# Patient Record
Sex: Female | Born: 1980 | Race: Black or African American | Hispanic: No | Marital: Single | Smoking: Never smoker
Health system: Southern US, Community
[De-identification: ages and names within clinical notes are randomized; demographics above are authoritative.]

## PROBLEM LIST (undated history)

## (undated) DIAGNOSIS — I10 Essential (primary) hypertension: Secondary | ICD-10-CM

## (undated) HISTORY — PX: TUBAL LIGATION: SHX77

---

## 2013-08-22 ENCOUNTER — Emergency Department: Payer: Self-pay | Admitting: Emergency Medicine

## 2016-07-27 ENCOUNTER — Emergency Department
Admission: EM | Admit: 2016-07-27 | Discharge: 2016-07-27 | Disposition: A | Discharge status: Home / Self Care | Payer: Managed Care, Other (non HMO) | Attending: Emergency Medicine | Admitting: Emergency Medicine

## 2016-07-27 ENCOUNTER — Encounter: Payer: Self-pay | Admitting: Emergency Medicine

## 2016-07-27 DIAGNOSIS — M5412 Radiculopathy, cervical region: Secondary | ICD-10-CM | POA: Diagnosis not present

## 2016-07-27 DIAGNOSIS — M25512 Pain in left shoulder: Secondary | ICD-10-CM | POA: Insufficient documentation

## 2016-07-27 DIAGNOSIS — I10 Essential (primary) hypertension: Secondary | ICD-10-CM | POA: Diagnosis not present

## 2016-07-27 HISTORY — DX: Essential (primary) hypertension: I10

## 2016-07-27 MED ORDER — PREDNISONE 10 MG PO TABS: ORAL_TABLET | ORAL | 0 refills | Status: DC

## 2016-07-27 MED ORDER — LIDOCAINE 5 % EX PTCH
1.0000 | MEDICATED_PATCH | CUTANEOUS | Status: DC
  Administered 2016-07-27: 1 via TRANSDERMAL
  Filled 2016-07-27: qty 1

## 2016-07-27 MED ORDER — METHYLPREDNISOLONE SODIUM SUCC 125 MG IJ SOLR
125.0000 mg | Freq: Once | INTRAMUSCULAR | Status: AC
  Administered 2016-07-27: 125 mg via INTRAMUSCULAR
  Filled 2016-07-27: qty 2

## 2016-07-27 MED ORDER — CYCLOBENZAPRINE HCL 5 MG PO TABS: 5.0000 mg | ORAL_TABLET | Freq: Three times a day (TID) | ORAL | 0 refills | Status: AC | PRN

## 2016-07-27 MED ORDER — KETOROLAC TROMETHAMINE 60 MG/2ML IM SOLN
30.0000 mg | Freq: Once | INTRAMUSCULAR | Status: AC
  Administered 2016-07-27: 30 mg via INTRAMUSCULAR
  Filled 2016-07-27: qty 2

## 2016-07-27 MED ORDER — IBUPROFEN 600 MG PO TABS: 600.0000 mg | ORAL_TABLET | Freq: Four times a day (QID) | ORAL | 0 refills | Status: DC | PRN

## 2016-07-27 NOTE — ED Notes (Signed)
Left lateral necj pain radiating to left arm x1 week.

## 2016-07-27 NOTE — ED Notes (Signed)

## 2016-07-27 NOTE — ED Triage Notes (Signed)
Pt presents to ED via POV c/o L-sided neck pain/stiffness x1 week. Pt states pain has gradually progressed down into L shoulder/scapula and is now into LFA. Full ROM to neck and LUE. Has been trying muscle rubs at home without relief.

## 2016-07-27 NOTE — ED Provider Notes (Signed)
[facility] Emergency Department Provider Note  ____________________________________________  Time seen: Approximately 11:48 AM  I have reviewed the triage vital signs and the nursing notes.   HISTORY  Chief Complaint Torticollis and Shoulder Pain    HPI Robyn Gordon is a 36 y.o. female that presents to the emergency department with left neck, shoulder, and arm pain for one week. Patient states that it started out with tightness on the left side of her neck one week ago. It has gradually gotten worse. The pain is primarily in her shoulder currently. When she moves her head to the left she occasionally gets a shooting pain that starts from her neck and runs down her arm. She has taken ibuprofen for pain without relief. She has been using hot compresses on her neck, which helps a little bit. She denies any trauma to her neck. This has never happened before. No tick bites. She denies fever, headache, visual changes, shortness breath, chest pain, nausea, vomiting, abdominal pain.   Past Medical History:  Diagnosis Date  . Hypertension     There are no active problems to display for this patient.   Past Surgical History:  Procedure Laterality Date  . TUBAL LIGATION      Prior to Admission medications   Medication Sig Start Date End Date Taking? Authorizing Provider  cyclobenzaprine (FLEXERIL) 5 MG tablet Take 1 tablet (5 mg total) by mouth 3 (three) times daily as needed for muscle spasms. 07/27/16 08/03/16  Enid Derry, PA-C  ibuprofen (ADVIL,MOTRIN) 600 MG tablet Take 1 tablet (600 mg total) by mouth every 6 (six) hours as needed. 07/27/16   Enid Derry, PA-C  predniSONE (DELTASONE) 10 MG tablet Take 6 tablets on day 1, take 5 tablets on day 2, take 4 tablets on day 3, take 3 tablets on day 4, take 2 tablets on day 5, take 1 tablet on day 6 07/27/16   Enid Derry, PA-C    Allergies Patient has no known allergies.  History reviewed. No  pertinent family history.  Social History Social History  Substance Use Topics  . Smoking status: Never Smoker  . Smokeless tobacco: Never Used  . Alcohol use No     Review of Systems  Constitutional: No fever/chills Cardiovascular: No chest pain. Respiratory: No SOB. Gastrointestinal: No abdominal pain.  No nausea, no vomiting.  Musculoskeletal: Positive for neck pain. Skin: Negative for rash, abrasions, lacerations, ecchymosis. Neurological: Negative for headaches   ____________________________________________   PHYSICAL EXAM:  VITAL SIGNS: ED Triage Vitals  Enc Vitals Group     BP 07/27/16 1130 (!) 149/91     Pulse Rate 07/27/16 1130 67     Resp 07/27/16 1130 16     Temp 07/27/16 1130 98.1 F (36.7 C)     Temp Source 07/27/16 1130 Oral     SpO2 07/27/16 1130 100 %     Weight 07/27/16 1130 142 lb (64.4 kg)     Height 07/27/16 1130 5\' 2"  (1.575 m)     Head Circumference --      Peak Flow --      Pain Score 07/27/16 1122 7     Pain Loc --      Pain Edu? --      Excl. in [place]? --      Constitutional: Alert and oriented. Well appearing and in no acute distress. Eyes: Conjunctivae are normal. PERRL. EOMI. Head: Atraumatic. ENT:      Ears:      Nose:  No congestion/rhinnorhea.      Mouth/Throat: Mucous membranes are moist.  Neck: No stridor.  No cervical spine tenderness to palpation. Tenderness to palpation over left trapezius muscle. Pain with left lateral rotation of neck. Cardiovascular: Normal rate, regular rhythm.  Good peripheral circulation. Respiratory: Normal respiratory effort without tachypnea or retractions. Lungs CTAB. Good air entry to the bases with no decreased or absent breath sounds. Musculoskeletal: Full range of motion to all extremities. No gross deformities appreciated. Neurologic:  Normal speech and language. No gross focal neurologic deficits are appreciated.  Skin:  Skin is warm, dry and intact. No rash noted. Psychiatric: Mood and affect  are normal. Speech and behavior are normal. Patient exhibits appropriate insight and judgement.   ____________________________________________   LABS (all labs ordered are listed, but only abnormal results are displayed)  Labs Reviewed - No data to display ____________________________________________  EKG   ____________________________________________  RADIOLOGY   No results found.  ____________________________________________    PROCEDURES  Procedure(s) performed:    Procedures    Medications  lidocaine (LIDODERM) 5 % 1 patch (1 patch Transdermal Patch Applied 07/27/16 1216)  ketorolac (TORADOL) injection 30 mg (30 mg Intramuscular Given 07/27/16 1215)  methylPREDNISolone sodium succinate (SOLU-MEDROL) 125 mg/2 mL injection 125 mg (125 mg Intramuscular Given 07/27/16 1215)     ____________________________________________   INITIAL IMPRESSION / ASSESSMENT AND PLAN / ED COURSE  Pertinent labs & imaging results that were available during my care of the patient were reviewed by me and considered in my medical decision making (see chart for details).  Review of the [place] CSRS was performed in accordance of the [facility] prior to dispensing any controlled drugs.  Patient's diagnosis is consistent with cervical radiculopathy. Vital signs and exam are reassuring. Patient was given Toradol and Solu-Medrol in ED. Patient will be discharged home with prescriptions for prednisone and ibuprofen. Patient is to follow up with PCP as directed. Patient is given ED precautions to return to the ED for any worsening or new symptoms.     ____________________________________________  FINAL CLINICAL IMPRESSION(S) / ED DIAGNOSES  Final diagnoses:  Acute pain of left shoulder  Cervical radiculopathy      NEW MEDICATIONS STARTED DURING THIS VISIT:  Discharge Medication List as of 07/27/2016 12:21 PM    START taking these medications   Details  ibuprofen (ADVIL,MOTRIN) 600 MG tablet  Take 1 tablet (600 mg total) by mouth every 6 (six) hours as needed., Starting Sat 07/27/2016, Print    predniSONE (DELTASONE) 10 MG tablet Take 6 tablets on day 1, take 5 tablets on day 2, take 4 tablets on day 3, take 3 tablets on day 4, take 2 tablets on day 5, take 1 tablet on day 6, Print            This chart was dictated using voice recognition software/Dragon. Despite best efforts to proofread, errors can occur which can change the meaning. Any change was purely unintentional.    Enid DerryWagner, Rya Rausch, PA-C 07/27/16 1322    Don PerkingVeronese, [place], MD 07/28/16 [phone]

## 2020-03-30 ENCOUNTER — Other Ambulatory Visit: Payer: Self-pay | Admitting: Student

## 2020-03-30 DIAGNOSIS — Z1231 Encounter for screening mammogram for malignant neoplasm of breast: Secondary | ICD-10-CM

## 2021-01-18 ENCOUNTER — Other Ambulatory Visit: Payer: Self-pay

## 2021-01-18 ENCOUNTER — Emergency Department: Payer: Commercial Managed Care - PPO

## 2021-01-18 ENCOUNTER — Emergency Department
Admission: EM | Admit: 2021-01-18 | Discharge: 2021-01-18 | Disposition: A | Discharge status: Home / Self Care | Payer: Commercial Managed Care - PPO | Attending: Emergency Medicine | Admitting: Emergency Medicine

## 2021-01-18 ENCOUNTER — Encounter: Payer: Self-pay | Admitting: Emergency Medicine

## 2021-01-18 DIAGNOSIS — M7541 Impingement syndrome of right shoulder: Secondary | ICD-10-CM | POA: Diagnosis not present

## 2021-01-18 DIAGNOSIS — I10 Essential (primary) hypertension: Secondary | ICD-10-CM | POA: Insufficient documentation

## 2021-01-18 DIAGNOSIS — M25511 Pain in right shoulder: Secondary | ICD-10-CM | POA: Diagnosis present

## 2021-01-18 NOTE — ED Provider Notes (Signed)
[facility] Emergency Department Provider Note    ____________________________________________   Event Date/Time   First MD Initiated Contact with Patient 01/18/21 1116     (approximate)  I have reviewed the triage vital signs and the nursing notes.   HISTORY  Chief Complaint Shoulder Pain   HPI Robyn Gordon is a 40 y.o. female, history of hypertension, presents emergency department for evaluation of left-sided shoulder pain.  Patient states that she first noticed her shoulder hurting 3 days ago when she was getting out of the car.  Denies any pop or crack sensation. Reports limited range of motion.  Denies any recent illness or injury.  He has no history of shoulder problems in the past.  Denies fever/chills, chest pain, shortness of breath, neck pain, back pain, or numbness/tingling sensation in her upper or lower extremities.  She has been taking ibuprofen for the pain.   History limited by: No limitations.  Past Medical History:  Diagnosis Date   Hypertension     There are no problems to display for this patient.   Past Surgical History:  Procedure Laterality Date   TUBAL LIGATION      Prior to Admission medications   Medication Sig Start Date End Date Taking? Authorizing Provider  ibuprofen (ADVIL,MOTRIN) 600 MG tablet Take 1 tablet (600 mg total) by mouth every 6 (six) hours as needed. 07/27/16   Laban Emperor, PA-C  predniSONE (DELTASONE) 10 MG tablet Take 6 tablets on day 1, take 5 tablets on day 2, take 4 tablets on day 3, take 3 tablets on day 4, take 2 tablets on day 5, take 1 tablet on day 6 07/27/16   Laban Emperor, PA-C    Allergies Patient has no known allergies.  History reviewed. No pertinent family history.  Social History Social History   Tobacco Use   Smoking status: Never   Smokeless tobacco: Never  Substance Use Topics   Alcohol use: No    Review of Systems Review of Systems  Constitutional:  Negative for  chills and fever.  HENT:  Negative for ear pain and sore throat.   Eyes:  Negative for blurred vision.  Respiratory:  Negative for sputum production and shortness of breath.   Cardiovascular:  Negative for chest pain and leg swelling.  Gastrointestinal:  Negative for abdominal pain and vomiting.  Genitourinary:  Negative for dysuria, flank pain and hematuria.  Musculoskeletal:  Negative for myalgias.       Positive for shoulder pain.  Skin:  Negative for rash.  Neurological:  Negative for headaches.    10-point ROS otherwise negative. ____________________________________________   PHYSICAL EXAM:  VITAL SIGNS: ED Triage Vitals [01/18/21 1055]  Enc Vitals Group     BP (!) 141/87     Pulse Rate 88     Resp 16     Temp 98.6 F (37 C)     Temp Source Oral     SpO2 100 %     Weight 141 lb 15.6 oz (64.4 kg)     Height 5\' 2"  (1.575 m)     Head Circumference      Peak Flow      Pain Score 4     Pain Loc      Pain Edu?      Excl. in [place]?     Physical Exam Constitutional:      General: She is not in acute distress.    Appearance: Normal appearance. She is not ill-appearing.  HENT:     Head: Normocephalic.     Nose: Nose normal.     Mouth/Throat:     Mouth: Mucous membranes are moist.     Pharynx: Oropharynx is clear.  Eyes:     Conjunctiva/sclera: Conjunctivae normal.     Pupils: Pupils are equal, round, and reactive to light.  Cardiovascular:     Rate and Rhythm: Normal rate and regular rhythm.  Pulmonary:     Effort: Pulmonary effort is normal. No respiratory distress.     Breath sounds: Normal breath sounds. No wheezing, rhonchi or rales.  Abdominal:     General: Abdomen is flat. There is no distension.     Palpations: Abdomen is soft.  Musculoskeletal:     Cervical back: Normal range of motion and neck supple.     Comments: Limited range of motion in the right shoulder, particularly with external and internal rotation.  Positive Hawkins and Neer's test.   Tenderness when palpating the proximal humerus.  Pulse, motor, sensation intact distally.   Skin:    General: Skin is warm and dry.  Neurological:     General: No focal deficit present.     Mental Status: She is alert. Mental status is at baseline.  Psychiatric:        Mood and Affect: Mood normal.        Behavior: Behavior normal.        Thought Content: Thought content normal.        Judgment: Judgment normal.     ____________________________________________    LABS  (all labs ordered are listed, but only abnormal results are displayed)  Labs Reviewed - No data to display   ____________________________________________   EKG Not applicable.   ____________________________________________    RADIOLOGY I personally viewed and evaluated these images as part of my medical decision making, as well as reviewing the written report by the radiologist.  ED Provider Interpretation: I agree with the interpretation of the radiologist.  Negative x-ray  DG Shoulder Right  Result Date: 01/18/2021 CLINICAL DATA:  Right shoulder pain. EXAM: RIGHT SHOULDER - 2+ VIEW COMPARISON:  None. FINDINGS: There is no evidence of fracture or dislocation. There is no evidence of arthropathy or other focal bone abnormality. Soft tissues are unremarkable. IMPRESSION: Negative. Electronically Signed   By: Marin Roberts M.D.   On: 01/18/2021 11:39    ____________________________________________   PROCEDURES  Procedures   Medications - No data to display  Critical Care performed: No  ____________________________________________   INITIAL IMPRESSION / ASSESSMENT AND PLAN / ED COURSE  Pertinent labs & imaging results that were available during my care of the patient were reviewed by me and considered in my medical decision making (see chart for details).        Robyn Gordon is a 40 y.o. female, history of hypertension, presents emergency department for evaluation of  left-sided shoulder pain.  Patient states that she first noticed her shoulder hurting 3 days ago when she was getting out of the car.  Denies any pop or crack sensation. Reports limited range of motion.  Denies any recent illness or injury.  He has no history of shoulder problems in the past.  Denies fever/chills, chest pain, shortness of breath, neck pain, back pain, or numbness/tingling sensation in her upper or lower extremities.  She has been taking ibuprofen for the pain.  Differentials include, but not limited to: Serious: ACS, intra-abdominal bleeding, pulmonary embolism, septic joint, shoulder dislocation, fracture (humerus, scapula, clavicle),  Common: rotator cuff injury, shoulder labrum tear, biceps tendinitis, impingement syndrome, adhesive capsulitis, AC joint injury   Patient appears well.  She is found sitting upright comfortably in bed.  No apparent distress.  Physical exam notable for limited range of motion in the right shoulder, particularly with external and internal rotation.  Positive Hawkins and Neer's test.  Tenderness when palpating the proximal humerus.  Pulse, motor, sensation intact distally.  Vital signs are within normal limits.  X-rays negative for acute fractures or dislocation.  Given the patient's history and physical exam, I suspect that the patient is likely suffering from a shoulder impingement syndrome.  No life-threatening pathology suspected.  Provided the patient with information for orthopedic follow-up.  Advised the patient to take Tylenol/ibuprofen as needed for the pain.  Instructed the patient to return the emergency department anytime if she begins to experience any new or worsening symptoms.      ____________________________________________   FINAL CLINICAL IMPRESSION(S) / ED DIAGNOSES  Final diagnoses:  Shoulder impingement syndrome, right     NEW MEDICATIONS STARTED DURING THIS VISIT:  ED Discharge Orders     None        Note:  This  document was prepared using Dragon voice recognition software and may include unintentional dictation errors.    Teodoro Spray, [place] 01/18/21 2047    Vladimir Crofts, MD 01/19/21 1520

## 2021-01-18 NOTE — Discharge Instructions (Addendum)
-  Continue to take ibuprofen or naproxen as needed for pain. -Follow-up with the orthopedist listed above. -Please return the emergency department if you begin to experience any new or worsening symptoms.

## 2021-01-18 NOTE — ED Triage Notes (Signed)
Pt comes into the ED via POV c/o right shoulder pain that started hurting 3 days ago after getting out of the car.  Pt denies hearing a "pop" or anything when getting out of the car and feeling the pain.  Since then patient reports increased tightness in the shoulder with decreased ROM.  Pt in NAD.

## 2022-04-06 IMAGING — CR DG SHOULDER 2+V*R*
1 series · 3 of 3 positions shown · non-contrast
Comparison: None.

CLINICAL DATA: Right shoulder pain.

EXAM:
RIGHT SHOULDER - 2+ VIEW

[Series 1: dg shoulder right · 0.14mm/px · 3 of 3 slices shown]
[im 1/3]
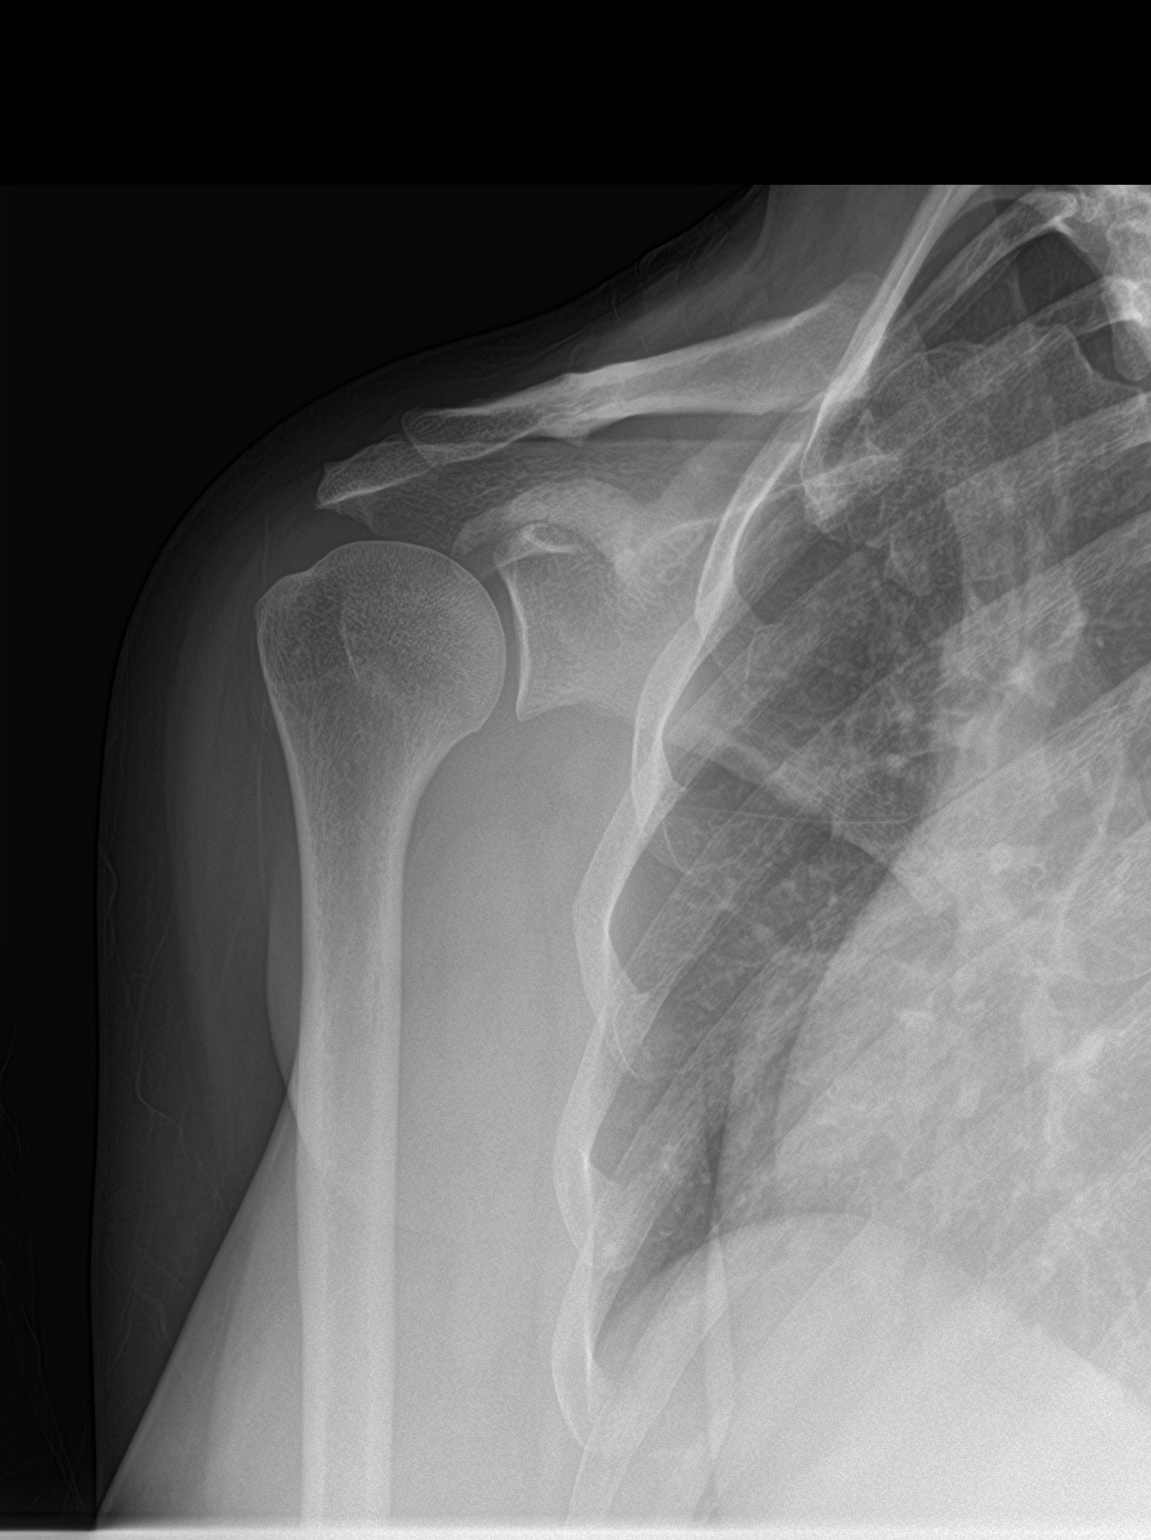
[im 2/3]
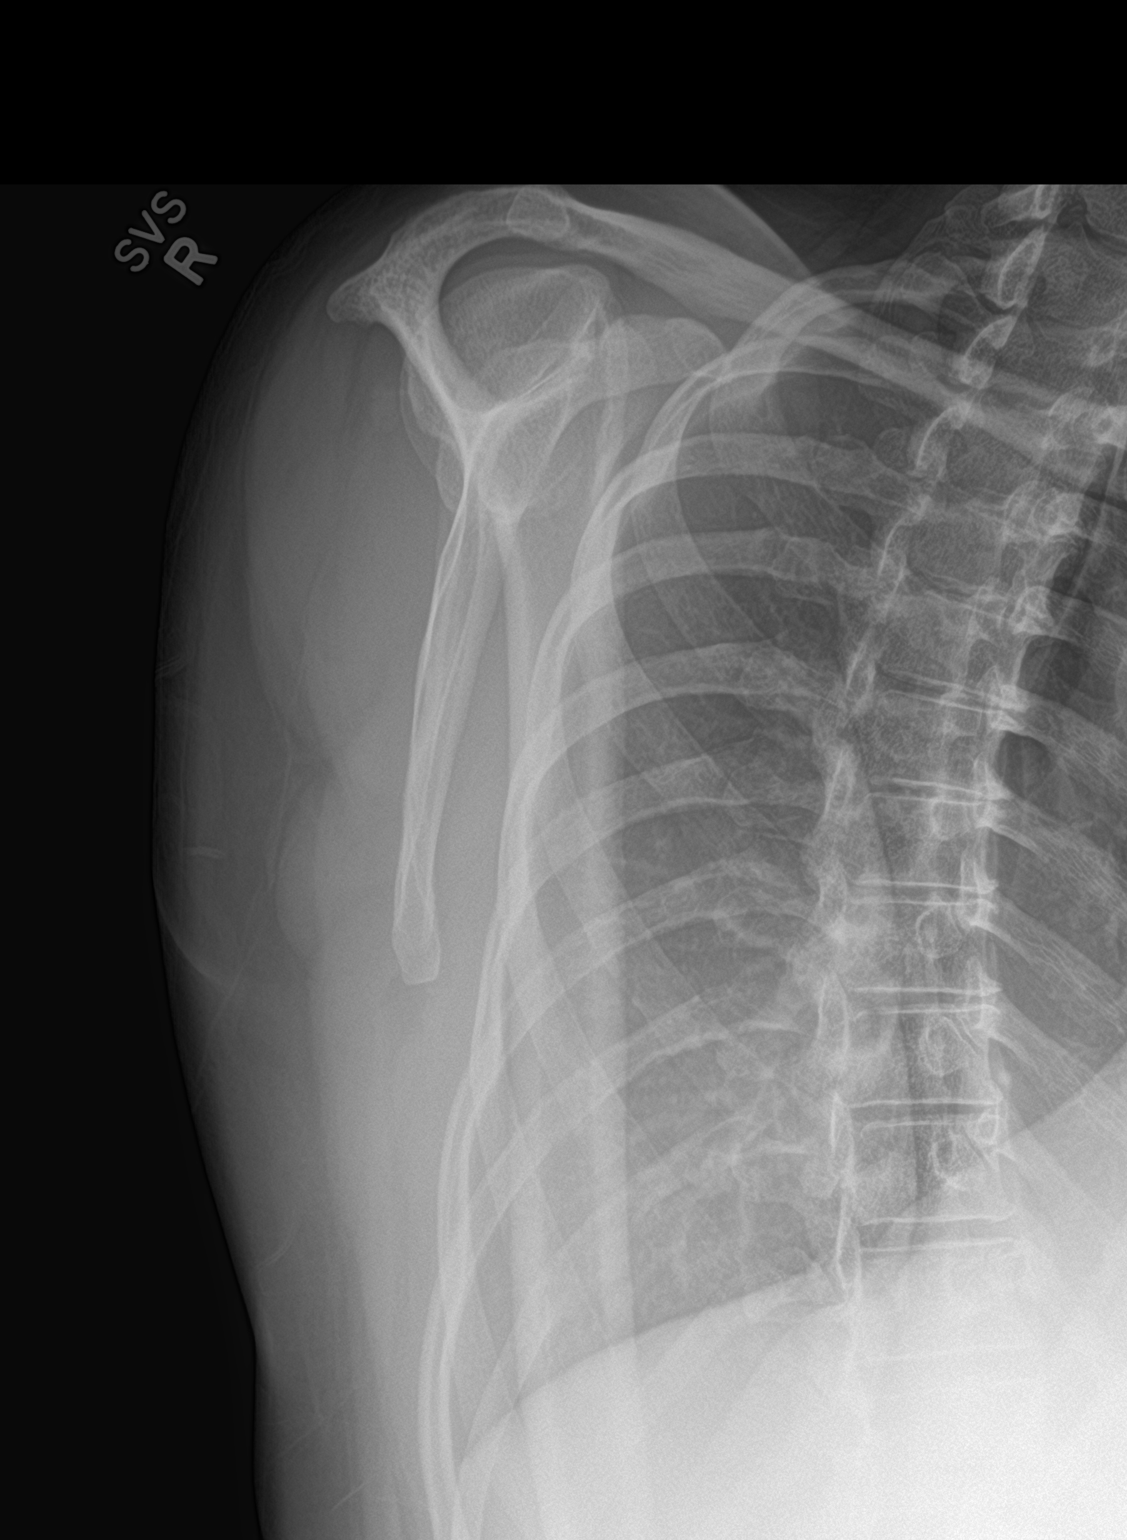
[im 3/3]
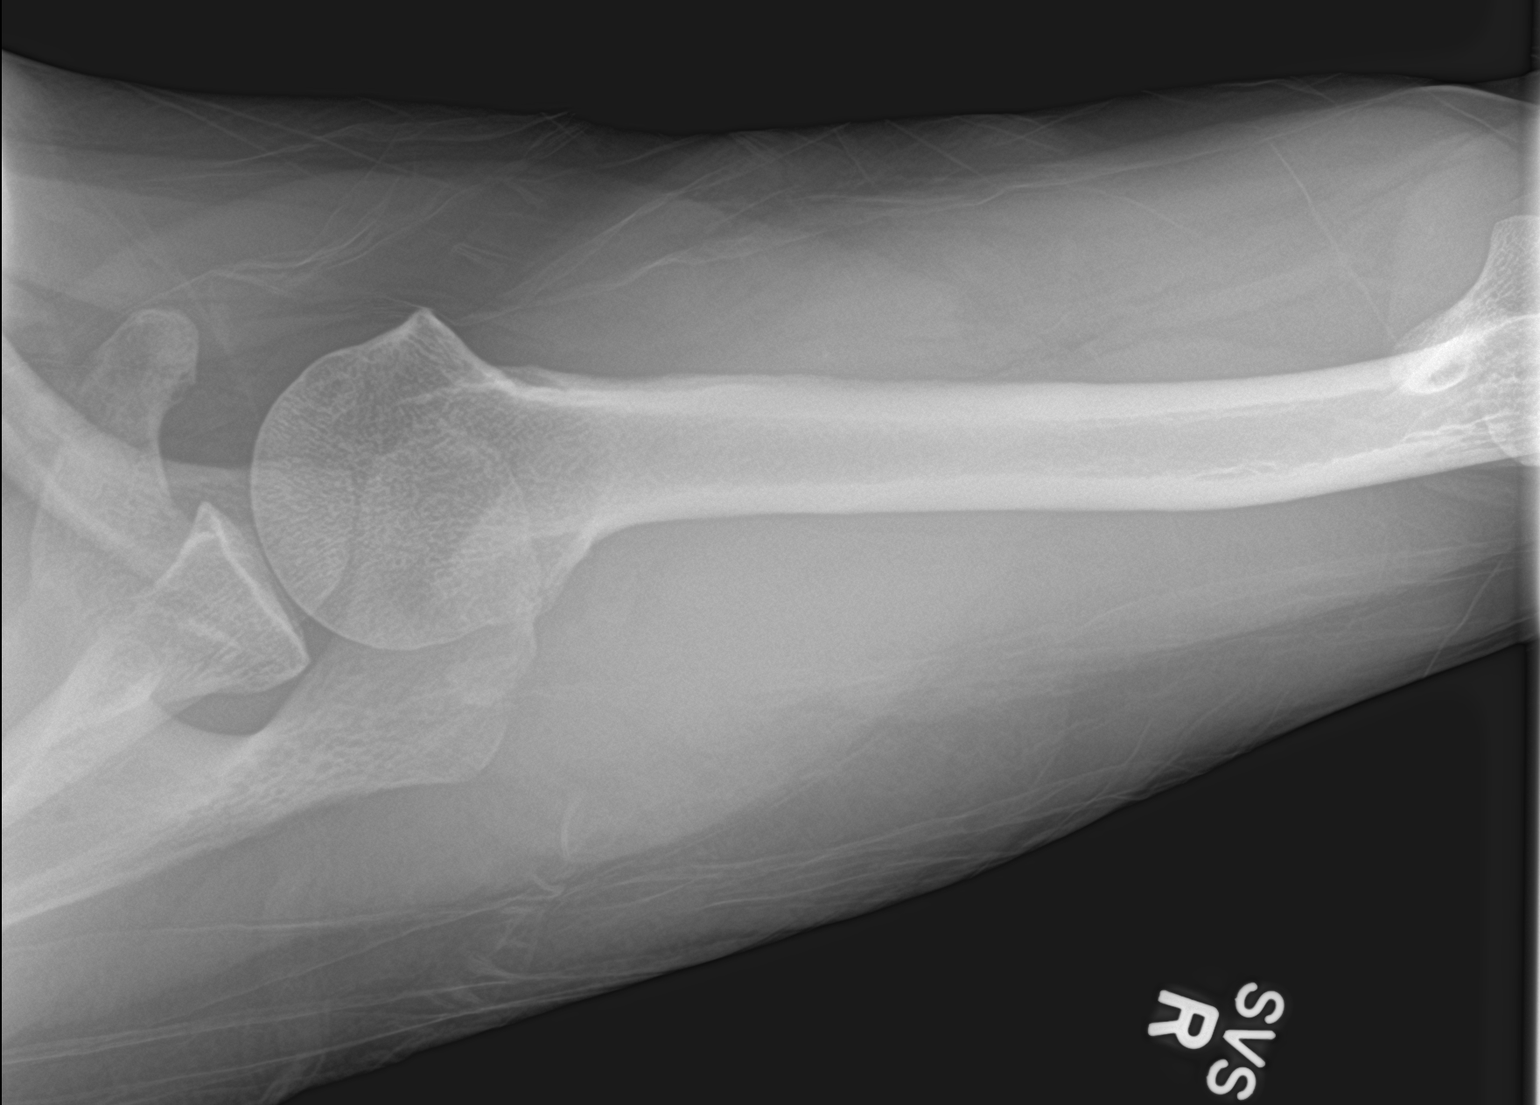

[3 of 3 positions shown; findings below may reference images not displayed]

FINDINGS: There is no evidence of fracture or dislocation. There is no
evidence of arthropathy or other focal bone abnormality. Soft
tissues are unremarkable.
IMPRESSION: Negative.

## 2022-08-14 NOTE — Progress Notes (Signed)
Referring Physician:  Carren Rang, PA-C [address] [place],  [place] [address]  Primary Physician:  Carren Rang, PA-C  History of Present Illness: 08/21/2022 Robyn Gordon has a history of HTN.   Has been seen in ED previously for right shoulder pain.   She has intermittent neck pain that radiates into the left shoulder and arm x 3-4 weeks. Had flare up of this pain a few years ago that resolved. She has numbness and tingling in the left thumb and index finger. Thinks she may have some slight weakness. No pain on right. She had improvement with medrol dose pack. Pain is worse at work- does a lot of standing. Also worse with sitting and laying flat.   She has been seeing [facility] for her lower back- was having LBP with right leg pain. Was told she had DDD. She's had ESIs with some relief. She still has constant LBP with no leg pain. No numbness, tingling, or weakness in her legs.   She is taking motrin with minimal relief.   Bowel/Bladder Dysfunction: she has some urinary urgency  Conservative measures:  Physical therapy: has not participated in recently   Multimodal medical therapy including regular antiinflammatories: tylenol, advil, prednisone  Injections:  has not received epidural steroid injections for her neck  Past Surgery: none  Robyn Gordon has no symptoms of cervical myelopathy.  The symptoms are causing a significant impact on the patient's life.   Review of Systems:  A 10 point review of systems is negative, except for the pertinent positives and negatives detailed in the HPI.  Past Medical History: Past Medical History:  Diagnosis Date   Hypertension     Past Surgical History: Past Surgical History:  Procedure Laterality Date   TUBAL LIGATION      Allergies: Allergies as of 08/21/2022   (No Known Allergies)    Medications: Outpatient Encounter Medications as of 08/21/2022  Medication Sig    amLODipine (NORVASC) 5 MG tablet Take 5 mg by mouth daily.   ibuprofen (ADVIL) 200 MG tablet Take 200 mg by mouth every 6 (six) hours as needed.   [DISCONTINUED] ibuprofen (ADVIL,MOTRIN) 600 MG tablet Take 1 tablet (600 mg total) by mouth every 6 (six) hours as needed.   [DISCONTINUED] predniSONE (DELTASONE) 10 MG tablet Take 6 tablets on day 1, take 5 tablets on day 2, take 4 tablets on day 3, take 3 tablets on day 4, take 2 tablets on day 5, take 1 tablet on day 6   No facility-administered encounter medications on file as of 08/21/2022.    Social History: Social History   Tobacco Use   Smoking status: Never   Smokeless tobacco: Never  Substance Use Topics   Alcohol use: No    Family Medical History: No family history on file.  Physical Examination: Vitals:   08/21/22 1007  BP: 130/82    General: Patient is well developed, well nourished, calm, collected, and in no apparent distress. Attention to examination is appropriate.  Respiratory: Patient is breathing without any difficulty.   NEUROLOGICAL:     Awake, alert, oriented to person, place, and time.  Speech is clear and fluent. Fund of knowledge is appropriate.   Cranial Nerves: Pupils equal round and reactive to light.  Facial tone is symmetric.    No posterior cervical tenderness. Mild tenderness in left trapezial region.   No abnormal lesions on exposed skin.   Strength: Side Biceps Triceps Deltoid Interossei Grip Wrist  Ext. Wrist Flex.  R 5 5 5 5 5 5 5   L 5 5 5 5 5 5 5    Side Iliopsoas Quads Hamstring PF DF EHL  R 5 5 5 5 5 5   L 5 5 5 5 5 5    Reflexes are 2+ and symmetric at the biceps, brachioradialis, patella and achilles.   Hoffman's is absent.  Clonus is not present.   Bilateral upper and lower extremity sensation is intact to light touch.     Gait is normal.    Medical Decision Making  Imaging: No cervical or lumbar imaging.   Assessment and Plan: Robyn Gordon is a pleasant 42 y.o. female has  intermittent neck pain that radiates into the left shoulder and arm x 3-4 weeks. Had flare up of this pain a few years ago that resolved. She has numbness and tingling in the left thumb and index finger. Thinks she may have some slight weakness. No pain on right.   No cervical imaging available. Pain appears to be cervical mediated.   She also has history of chronic constant LBP with right leg pain. Right leg pain has been better since last ESI at [facility], but LBP is constant. No numbness, tingling, or weakness in her legs.   No lumbar imaging available.   Treatment options discussed with patient and following plan made:   - MRI of cervical spine to further evaluate chronic neck pain and cervical radiculopathy.  - Stop motrin. New prescription for mobic. Reviewed dosing and side effects. Take with food.  - ROI signed to get her records from [facility]. She will get lumbar MRI on CD and drop off for me to review.  - Depending on results of cervical MRI, may consider PT and/or injections.  - Will regroup on treatment for lumbar spine once I get her records and imaging from [facility].  - Will schedule follow up visit to review MRI results once I get them back.  - Work note done. She does a lot of heavy lifting at work. Will do lifting/pushing/pulling restrictions pending her follow up.   I spent a total of 30 minutes in face-to-face and non-face-to-face activities related to this patient's care today including review of outside records, review of imaging, review of symptoms, physical exam, discussion of differential diagnosis, discussion of treatment options, and documentation.   Thank you for involving me in the care of this patient.   Drake Leach PA-C Dept. of Neurosurgery

## 2022-08-21 ENCOUNTER — Ambulatory Visit: Payer: Commercial Managed Care - PPO | Admitting: Orthopedic Surgery

## 2022-08-21 ENCOUNTER — Encounter: Payer: Self-pay | Admitting: Orthopedic Surgery

## 2022-08-21 DIAGNOSIS — M542 Cervicalgia: Secondary | ICD-10-CM

## 2022-08-21 DIAGNOSIS — M5441 Lumbago with sciatica, right side: Secondary | ICD-10-CM

## 2022-08-21 DIAGNOSIS — M5412 Radiculopathy, cervical region: Secondary | ICD-10-CM

## 2022-08-21 DIAGNOSIS — M5416 Radiculopathy, lumbar region: Secondary | ICD-10-CM

## 2022-08-21 DIAGNOSIS — G8929 Other chronic pain: Secondary | ICD-10-CM

## 2022-08-21 MED ORDER — MELOXICAM 15 MG PO TABS: 15.0000 mg | ORAL_TABLET | Freq: Every day | ORAL | 2 refills | Status: AC

## 2022-08-21 NOTE — Patient Instructions (Signed)
It was so nice to see you today. Thank you so much for coming in.    I want to get an MRI of your neck to look into things further. We will get this approved through your insurance and [facility] will call you to schedule the appointment.   [facility] is (building with the white pillars) off of [address]. The address is [address], [place], [place] [address].   After you have the MRI, it takes 5-7 days for me to get the results back. Once I have them, we will call you to schedule a follow up visit with me to review them.   We will work on getting your records from [facility] regarding your back. You will need to call them and pick up your lumbar MRI on a CD. Please drop this off to [place] to upload in our system.   Stop the motrin. I sent a prescription for meloxicam to help with pain and inflammation. Take as directed with food.   Please do not hesitate to call if you have any questions or concerns. You can also message me in MyChart.   If you have not heard back about any of the above things in the next week, please call the office so we can help you get them scheduled.   Drake Leach PA-C [phone]

## 2022-09-04 ENCOUNTER — Telehealth: Payer: Self-pay | Admitting: Orthopedic Surgery

## 2022-09-04 ENCOUNTER — Ambulatory Visit
Admission: RE | Admit: 2022-09-04 | Discharge: 2022-09-04 | Disposition: A | Discharge status: Home / Self Care | Payer: Commercial Managed Care - PPO | Source: Ambulatory Visit | Attending: Orthopedic Surgery | Admitting: Orthopedic Surgery

## 2022-09-04 DIAGNOSIS — M542 Cervicalgia: Secondary | ICD-10-CM | POA: Insufficient documentation

## 2022-09-04 DIAGNOSIS — M5412 Radiculopathy, cervical region: Secondary | ICD-10-CM | POA: Diagnosis present

## 2022-09-04 NOTE — Telephone Encounter (Signed)
I can do a note keeping her out from today 8/14 until her f/u with me on 8/26. Will do and print. She will need to pick up.

## 2022-09-04 NOTE — Telephone Encounter (Signed)
Left message to pick up note  °

## 2022-09-04 NOTE — Telephone Encounter (Signed)
Patient is asking if you can write her out of work until she sees you. Her pain has increased in neck, left arm spasms, and the tingling has increased in her left arm also. Her MRI is scheduled for today at 4pm and she is scheduled to see you on 8/26 to review the results.

## 2022-09-05 NOTE — Telephone Encounter (Signed)
Note was picked up.

## 2022-09-11 ENCOUNTER — Encounter: Payer: Self-pay | Admitting: Orthopedic Surgery

## 2022-09-11 NOTE — Progress Notes (Signed)
Cervical MRI dated 09/04/22:  FINDINGS: Alignment: Straightening of the normal cervical lordosis. Trace degenerative retrolisthesis of C6 on C7.   Vertebrae: Vertebral body height maintained without acute or chronic fracture. Bone marrow signal intensity within normal limits. Small benign hemangioma noted involving the right inferior articular process of C3. No worrisome osseous lesions. Mild reactive endplate changes present about the C5-6 and C6-7 interspaces. No other abnormal marrow edema.   Cord: Normal signal and morphology.   Posterior Fossa, vertebral arteries, paraspinal tissues: Empty sella. Visualized brain and posterior fossa otherwise unremarkable. Craniocervical junction normal. Small retention cyst noted at the nasopharynx. Paraspinous soft tissues within normal limits. Normal flow voids seen within the vertebral arteries bilaterally.   Disc levels:   C2-C3: Minimal uncovertebral spurring without significant disc bulge. Mild right-sided facet hypertrophy. No spinal stenosis. Foramina remain patent.   C3-C4: Disc bulge with bilateral uncovertebral spurring, slightly asymmetric to the right. Flattening of the ventral thecal sac without significant spinal stenosis. Foramina remain adequately patent.   C4-C5: Central to right paracentral disc protrusion mildly indents the ventral thecal sac (series 19, image 11). Minimal flattening of the ventral cord without cord signal changes. Thecal sac remains fairly capacious without significant stenosis. Superimposed mild uncovertebral and facet hypertrophy without significant foraminal encroachment.   C5-C6: Degenerative intervertebral disc space narrowing. Left paracentral disc osteophyte complex flattens and indents the ventral thecal sac (series 9, image 16). Mild cord flattening without cord signal changes. Mild spinal stenosis. Superimposed uncovertebral spurring with resultant mild right with moderate left C6  foraminal narrowing.   C6-C7: Degenerative intervertebral disc space narrowing. Left paracentral disc osteophyte complex flattens and indents the ventral thecal sac. Mild cord flattening without cord signal changes. Mild spinal stenosis. Superimposed uncovertebral spurring with resultant moderate bilateral C7 foraminal stenosis.   C7-T1: Negative interspace. Mild right-sided facet hypertrophy. No canal or foraminal stenosis.   T1-2: Small left paracentral disc protrusion indents the ventral thecal sac (series 8, image 28). Mild facet hypertrophy. No spinal stenosis. Foramina remain patent.   IMPRESSION: 1. Small central to right paracentral disc protrusion at C4-5 with minimal flattening of the ventral cord, but no significant spinal stenosis. 2. Left paracentral disc osteophyte complexes at C5-6 and C6-7 with resultant mild spinal stenosis, with moderate left C6 and bilateral C7 foraminal narrowing. 3. Small left paracentral disc protrusion at T1-2 without significant stenosis. 4. Empty sella. While this finding is often incidental in nature and of no clinical significance, this can also be seen in the setting of idiopathic intracranial hypertension.     Electronically Signed   By: Rise Mu M.D.   On: 09/07/2022 04:10  Above MRI reviewed with Dr. Katrinka Blazing. No further workup needed for empty sella.   She has f/u on 09/16/22 to review MRI.

## 2022-09-13 NOTE — Progress Notes (Unsigned)
Referring Physician:  No referring provider defined for this encounter.  Primary Physician:  Carren Rang, PA-C  History of Present Illness: 09/16/2022 Ms. Montzerrat Gloss has a history of HTN.   Last saw me on 08/21/22 for neck and left arm pain. She was written out of work until this visit and was given mobic.   She was to get lumbar MRI from [facility] for me to review. Per their records, MRI from 08/17/19 showed severe DDD L5-S1 with disc bulging and slight desiccation of L4-L5.   She is here to review her cervical MRI scan.   She continues with more constant neck pain that radiates into left arm to her thumb and index finger. She has numbness and tingling in her left arm. She notes some weakness in her left arm. No right sided arm pain.   She states her lower back is doing well since ESIs with [facility].   She was started on neurontin with some improvement.   Bowel/Bladder Dysfunction: she has some urinary urgency  Conservative measures:  Physical therapy: has not participated in recently   Multimodal medical therapy including regular antiinflammatories: tylenol, advil, prednisone  Injections:  has not received epidural steroid injections for her neck  Past Surgery: none  Elecia Wynelle Fanny Burgio has no symptoms of cervical myelopathy.  The symptoms are causing a significant impact on the patient's life.   Review of Systems:  A 10 point review of systems is negative, except for the pertinent positives and negatives detailed in the HPI.  Past Medical History: Past Medical History:  Diagnosis Date   Hypertension     Past Surgical History: Past Surgical History:  Procedure Laterality Date   TUBAL LIGATION      Allergies: Allergies as of 09/16/2022   (No Known Allergies)    Medications: Outpatient Encounter Medications as of 09/16/2022  Medication Sig   amLODipine (NORVASC) 5 MG tablet Take 5 mg by mouth daily.   meloxicam (MOBIC) 15 MG tablet Take 1 tablet (15  mg total) by mouth daily. Take with food.   NEURONTIN 300 MG capsule Take 1 capsule 3 times a day by oral route for 30 days.   No facility-administered encounter medications on file as of 09/16/2022.    Social History: Social History   Tobacco Use   Smoking status: Never   Smokeless tobacco: Never  Substance Use Topics   Alcohol use: No    Family Medical History: History reviewed. No pertinent family history.  Physical Examination: Vitals:   09/16/22 1459  BP: 138/78      Awake, alert, oriented to person, place, and time.  Speech is clear and fluent. Fund of knowledge is appropriate.   Cranial Nerves: Pupils equal round and reactive to light.  Facial tone is symmetric.    No abnormal lesions on exposed skin.   Strength: Side Biceps Triceps Deltoid Interossei Grip Wrist Ext. Wrist Flex.  R 5 5 5 5 5 5 5   L 5 5 5 5 5 5 5    Side Iliopsoas Quads Hamstring PF DF EHL  R 5 5 5 5 5 5   L 5 5 5 5 5 5    Reflexes are 2+ and symmetric at the biceps, brachioradialis, patella and achilles.   Hoffman's is absent.  Clonus is not present.   Bilateral upper and lower extremity sensation is intact to light touch.     Gait is normal.    Medical Decision Making  Imaging: MRI of cervical spine dated 09/04/22:  FINDINGS: Alignment: Straightening of the normal cervical lordosis. Trace degenerative retrolisthesis of C6 on C7.   Vertebrae: Vertebral body height maintained without acute or chronic fracture. Bone marrow signal intensity within normal limits. Small benign hemangioma noted involving the right inferior articular process of C3. No worrisome osseous lesions. Mild reactive endplate changes present about the C5-6 and C6-7 interspaces. No other abnormal marrow edema.   Cord: Normal signal and morphology.   Posterior Fossa, vertebral arteries, paraspinal tissues: Empty sella. Visualized brain and posterior fossa otherwise unremarkable. Craniocervical junction normal. Small  retention cyst noted at the nasopharynx. Paraspinous soft tissues within normal limits. Normal flow voids seen within the vertebral arteries bilaterally.   Disc levels:   C2-C3: Minimal uncovertebral spurring without significant disc bulge. Mild right-sided facet hypertrophy. No spinal stenosis. Foramina remain patent.   C3-C4: Disc bulge with bilateral uncovertebral spurring, slightly asymmetric to the right. Flattening of the ventral thecal sac without significant spinal stenosis. Foramina remain adequately patent.   C4-C5: Central to right paracentral disc protrusion mildly indents the ventral thecal sac (series 19, image 11). Minimal flattening of the ventral cord without cord signal changes. Thecal sac remains fairly capacious without significant stenosis. Superimposed mild uncovertebral and facet hypertrophy without significant foraminal encroachment.   C5-C6: Degenerative intervertebral disc space narrowing. Left paracentral disc osteophyte complex flattens and indents the ventral thecal sac (series 9, image 16). Mild cord flattening without cord signal changes. Mild spinal stenosis. Superimposed uncovertebral spurring with resultant mild right with moderate left C6 foraminal narrowing.   C6-C7: Degenerative intervertebral disc space narrowing. Left paracentral disc osteophyte complex flattens and indents the ventral thecal sac. Mild cord flattening without cord signal changes. Mild spinal stenosis. Superimposed uncovertebral spurring with resultant moderate bilateral C7 foraminal stenosis.   C7-T1: Negative interspace. Mild right-sided facet hypertrophy. No canal or foraminal stenosis.   T1-2: Small left paracentral disc protrusion indents the ventral thecal sac (series 8, image 28). Mild facet hypertrophy. No spinal stenosis. Foramina remain patent.   IMPRESSION: 1. Small central to right paracentral disc protrusion at C4-5 with minimal flattening of the ventral  cord, but no significant spinal stenosis. 2. Left paracentral disc osteophyte complexes at C5-6 and C6-7 with resultant mild spinal stenosis, with moderate left C6 and bilateral C7 foraminal narrowing. 3. Small left paracentral disc protrusion at T1-2 without significant stenosis. 4. Empty sella. While this finding is often incidental in nature and of no clinical significance, this can also be seen in the setting of idiopathic intracranial hypertension.     Electronically Signed   By: Rise Mu M.D.   On: 09/07/2022 04:10   I have personally reviewed the images and agree with the above interpretation.  Above MRI reviewed with Dr. Katrinka Blazing prior to her visit. No further workup regarding emtpy sella.    Assessment and Plan: Ms. Hoffert is a pleasant 42 y.o. female has more constant neck pain that radiates into left arm to her thumb and index finger. She has numbness and tingling in her left arm. She notes some weakness in her left arm. No right sided arm pain.   She has known left paracentral disc at C5-C6 and C6-C7 with mild central stenosis, moderate left C6 and bilateral C7 foraminal stenosis. Also with right sided disc C4-C5.   She states LBP is doing well since having ESI at [facility]. Last MRI in 2021. This has been scanned into our system.   Treatment options discussed with patient and following plan made:   -  Referral to PMR at [facility] for cervical injections.  - PT for cervical spine. Orders to [facility].  - She has seen [facility] for her neck as well. She thinks she may be able to get in for [facility] with them quickly.  - If she sees [facility] for ESI, then I recommend she follow up with them for continued care.  - For now, I will schedule her a f/u with me in 6-8 weeks.  - I have given her a work note to go back on 09/30/22 with restrictions. She will need to get further notes from [facility] if she is transferring her care there.   I spent a total of 25 minutes in face-to-face and  non-face-to-face activities related to this patient's care today including review of outside records, review of imaging, review of symptoms, physical exam, discussion of differential diagnosis, discussion of treatment options, and documentation.   Drake Leach PA-C Dept. of Neurosurgery

## 2022-09-16 ENCOUNTER — Inpatient Hospital Stay
Admission: RE | Admit: 2022-09-16 | Discharge: 2022-09-16 | Disposition: A | Discharge status: Home / Self Care | Payer: Self-pay | Source: Ambulatory Visit | Attending: Orthopedic Surgery | Admitting: Orthopedic Surgery

## 2022-09-16 ENCOUNTER — Encounter: Payer: Self-pay | Admitting: Orthopedic Surgery

## 2022-09-16 ENCOUNTER — Other Ambulatory Visit: Payer: Self-pay

## 2022-09-16 ENCOUNTER — Ambulatory Visit (INDEPENDENT_AMBULATORY_CARE_PROVIDER_SITE_OTHER): Payer: Commercial Managed Care - PPO | Admitting: Orthopedic Surgery

## 2022-09-16 VITALS — BP 138/78 | Ht 62.0 in | Wt 170.0 lb

## 2022-09-16 DIAGNOSIS — Z049 Encounter for examination and observation for unspecified reason: Secondary | ICD-10-CM

## 2022-09-16 DIAGNOSIS — M4802 Spinal stenosis, cervical region: Secondary | ICD-10-CM

## 2022-09-16 DIAGNOSIS — M50121 Cervical disc disorder at C4-C5 level with radiculopathy: Secondary | ICD-10-CM | POA: Diagnosis not present

## 2022-09-16 DIAGNOSIS — M5412 Radiculopathy, cervical region: Secondary | ICD-10-CM

## 2022-09-16 DIAGNOSIS — M47812 Spondylosis without myelopathy or radiculopathy, cervical region: Secondary | ICD-10-CM

## 2022-09-16 NOTE — Patient Instructions (Signed)
It was so nice to see you today. Thank you so much for coming in.    You have some wear and tear in your neck and I think this is causing your neck and left arm pain.   I sent physical therapy orders to [facility]. You can call them if you don't hear from them to schedule your visit.    I want you to see physical medicine and rehab at the [facility] to discuss possible injections in your neck. Dr. Yves Dill, Dr. Mariah Milling, and their PA Alphonzo Lemmings are great and will take good care of you. They should call you to schedule an appointment or you can call them at [phone].   I will see you back in 6-8 weeks. Please do not hesitate to call if you have any questions or concerns. You can also message me in MyChart.   Drake Leach PA-C [phone]

## 2022-11-04 ENCOUNTER — Encounter: Payer: Self-pay | Admitting: Family Medicine

## 2022-11-04 ENCOUNTER — Telehealth: Payer: Self-pay | Admitting: Orthopedic Surgery

## 2022-11-04 DIAGNOSIS — M5412 Radiculopathy, cervical region: Secondary | ICD-10-CM

## 2022-11-04 DIAGNOSIS — M47812 Spondylosis without myelopathy or radiculopathy, cervical region: Secondary | ICD-10-CM

## 2022-11-04 DIAGNOSIS — M542 Cervicalgia: Secondary | ICD-10-CM

## 2022-11-04 NOTE — Addendum Note (Signed)
Addended by: Drake Leach on: 11/04/2022 11:59 AM   Modules accepted: Orders

## 2022-11-04 NOTE — Telephone Encounter (Signed)
Thoughts? Patients next appt is on 11/19/22.

## 2022-11-04 NOTE — Telephone Encounter (Signed)
We discussed at her visit that if she had injection with [facility], then she would continue to follow up with them regarding her neck.   If she still wants to see me, she will need to get a copy of her records from [facility] (including injection she had done) and bring with her. The other option is having her follow up with [facility].   I put in orders for her to go to PT at [facility]. They should call her.   Okay to give a note continuing her work restrictions until follow up with me on 11/19/22.

## 2022-11-04 NOTE — Telephone Encounter (Signed)
I sent her to PMR at [facility] for injections and looks like she has not seen them.   Did she get her injections at [facility]?

## 2022-11-04 NOTE — Telephone Encounter (Signed)
Spoke with patient to reschedule her follow up appointment Wednesday 10.16.24. patient is wondering since she will not be seeing Drake Leach this week, can she have a new work note to continue her restrictions until her next f/u appointment?   Patient states even with the injections she has had, she is still having pain in her neck.

## 2022-11-04 NOTE — Telephone Encounter (Signed)
Spoke to patient she will get her records from [facility] and she wants to continue care at our office. A note has been done and is up front for pick up.

## 2022-11-04 NOTE — Telephone Encounter (Signed)
She had an injection at [facility] on September 4th. She had relief for about a week. The pain and burning and aching has come back, the tingling has gone away. She is also wanting to see if she can do PT at another location, [facility] possibly.

## 2022-11-06 ENCOUNTER — Ambulatory Visit: Payer: Commercial Managed Care - PPO | Admitting: Orthopedic Surgery

## 2022-11-17 NOTE — Progress Notes (Deleted)
Referring Physician:  Carren Rang, PA-C [address] [place],  [place] [address]  Primary Physician:  Carren Rang, PA-C  History of Present Illness: 11/17/2022 Ms. Hilarie Figge has a history of HTN.   Last seen by me on 09/16/22 for more constant neck pain that radiates into left arm to her thumb and index finger. She has numbness and tingling in her left arm. She notes some weakness in her left arm. No right sided arm pain.    She has known left paracentral disc at C5-C6 and C6-C7 with mild central stenosis, moderate left C6 and bilateral C7 foraminal stenosis. Also with right sided disc C4-C5.   She had cervical ESI at [facility]- she had C7-T1 IL ESI on 09/25/22.   PT was ordered at [facility] and not started yet. She has been out of work since her last visit with me.   She is here for follow up.      Last saw me on 08/21/22 for neck and left arm pain. She was written out of work until this visit and was given mobic.   She was to get lumbar MRI from [facility] for me to review. Per their records, MRI from 08/17/19 showed severe DDD L5-S1 with disc bulging and slight desiccation of L4-L5.   She is here to review her cervical MRI scan.   She continues with more constant neck pain that radiates into left arm to her thumb and index finger. She has numbness and tingling in her left arm. She notes some weakness in her left arm. No right sided arm pain.   She states her lower back is doing well since ESIs with [facility].   She was started on neurontin with some improvement.   Bowel/Bladder Dysfunction: she has some urinary urgency  Conservative measures:  Physical therapy: has not participated in recently   Multimodal medical therapy including regular antiinflammatories: tylenol, advil, prednisone  Injections:   C7-T1 IL ESI 09/25/22 ([facility])  Past Surgery: none  Caretha Herndon Orlowski has no symptoms of cervical myelopathy.  The symptoms are causing a  significant impact on the patient's life.   Review of Systems:  A 10 point review of systems is negative, except for the pertinent positives and negatives detailed in the HPI.  Past Medical History: Past Medical History:  Diagnosis Date   Hypertension     Past Surgical History: Past Surgical History:  Procedure Laterality Date   TUBAL LIGATION      Allergies: Allergies as of 11/19/2022   (No Known Allergies)    Medications: Outpatient Encounter Medications as of 11/19/2022  Medication Sig   amLODipine (NORVASC) 5 MG tablet Take 5 mg by mouth daily.   meloxicam (MOBIC) 15 MG tablet Take 1 tablet (15 mg total) by mouth daily. Take with food.   NEURONTIN 300 MG capsule Take 1 capsule 3 times a day by oral route for 30 days.   No facility-administered encounter medications on file as of 11/19/2022.    Social History: Social History   Tobacco Use   Smoking status: Never   Smokeless tobacco: Never  Substance Use Topics   Alcohol use: No    Family Medical History: No family history on file.  Physical Examination: There were no vitals filed for this visit.     Awake, alert, oriented to person, place, and time.  Speech is clear and fluent. Fund of knowledge is appropriate.   Cranial Nerves: Pupils equal round and reactive to light.  Facial  tone is symmetric.    No abnormal lesions on exposed skin.   Strength: Side Biceps Triceps Deltoid Interossei Grip Wrist Ext. Wrist Flex.  R 5 5 5 5 5 5 5   L 5 5 5 5 5 5 5    Side Iliopsoas Quads Hamstring PF DF EHL  R 5 5 5 5 5 5   L 5 5 5 5 5 5    Reflexes are 2+ and symmetric at the biceps, brachioradialis, patella and achilles.   Hoffman's is absent.  Clonus is not present.   Bilateral upper and lower extremity sensation is intact to light touch.     Gait is normal.    Medical Decision Making  Imaging: None   Assessment and Plan: Ms. Turnier is a pleasant 42 y.o. female has more constant neck pain that radiates  into left arm to her thumb and index finger. She has numbness and tingling in her left arm. She notes some weakness in her left arm. No right sided arm pain.   She has known left paracentral disc at C5-C6 and C6-C7 with mild central stenosis, moderate left C6 and bilateral C7 foraminal stenosis. Also with right sided disc C4-C5.   She states LBP is doing well since having ESI at [facility]. Last MRI in 2021. This has been scanned into our system.   Treatment options discussed with patient and following plan made:   - Referral to PMR at [facility] for cervical injections.  - PT for cervical spine. Orders to [facility].  - She has seen [facility] for her neck as well. She thinks she may be able to get in for [facility] with them quickly.  - If she sees [facility] for ESI, then I recommend she follow up with them for continued care.  - For now, I will schedule her a f/u with me in 6-8 weeks.  - I have given her a work note to go back on 09/30/22 with restrictions. She will need to get further notes from [facility] if she is transferring her care there.   I spent a total of 25 minutes in face-to-face and non-face-to-face activities related to this patient's care today including review of outside records, review of imaging, review of symptoms, physical exam, discussion of differential diagnosis, discussion of treatment options, and documentation.   Drake Leach PA-C Dept. of Neurosurgery

## 2022-11-19 ENCOUNTER — Ambulatory Visit: Payer: Commercial Managed Care - PPO | Admitting: Orthopedic Surgery

## 2022-11-26 ENCOUNTER — Ambulatory Visit: Payer: Commercial Managed Care - PPO | Admitting: Physical Therapy

## 2022-11-28 ENCOUNTER — Ambulatory Visit: Payer: Commercial Managed Care - PPO | Attending: Orthopedic Surgery | Admitting: Physical Therapy

## 2022-11-28 ENCOUNTER — Ambulatory Visit: Payer: Commercial Managed Care - PPO | Admitting: Physical Therapy

## 2022-11-28 VITALS — BP 120/100 | HR 86

## 2022-11-28 DIAGNOSIS — M5412 Radiculopathy, cervical region: Secondary | ICD-10-CM | POA: Diagnosis not present

## 2022-11-28 DIAGNOSIS — M542 Cervicalgia: Secondary | ICD-10-CM | POA: Diagnosis present

## 2022-11-28 DIAGNOSIS — M47812 Spondylosis without myelopathy or radiculopathy, cervical region: Secondary | ICD-10-CM | POA: Diagnosis not present

## 2022-11-28 NOTE — Therapy (Signed)
OUTPATIENT PHYSICAL THERAPY CERVICAL EVALUATION   Patient Name: Robyn Gordon MRN: [id number] DOB:1980-05-05, 42 y.o., female Today's Date: 11/28/2022  END OF SESSION:  PT End of Session - 11/28/22 1825     Visit Number 1    Number of Visits 20    Date for PT Re-Evaluation 02/06/23    Authorization Type UHC/UMR 2024    Authorization Time Period 10    Authorization - Visit Number 1    Authorization - Number of Visits 20    Progress Note Due on Visit 10    PT Start Time 0447    PT Stop Time 0532    PT Time Calculation (min) 45 min    Activity Tolerance Patient tolerated treatment well    Behavior During Therapy WFL for tasks assessed/performed             Past Medical History:  Diagnosis Date   Hypertension    Past Surgical History:  Procedure Laterality Date   TUBAL LIGATION     There are no problems to display for this patient.   PCP: Carren Rang, PA-C   REFERRING PROVIDER: Drake Leach, PA-C   REFERRING DIAG:  [phone] (ICD-10-CM) - Cervical radiculopathy M47.812 (ICD-10-CM) - Cervical spondylosis M54.2 (ICD-10-CM) - Neck pain    THERAPY DIAG:  Neck pain on left side  Rationale for Evaluation and Treatment: Rehabilitation  ONSET DATE: ~6 months  SUBJECTIVE:                                                                                                                                                                                                         SUBJECTIVE STATEMENT: Pt having no pain right now.  Hand dominance: Right  PERTINENT HISTORY:  Left sided neck pain with pain, numbness and tingling on left side. Pain started as neck pain with temperature sensativity. Had a steroid injection which helped, but numbness has started to come back. Head position affects symptoms down arm.  PAIN:  Are you having pain? Yes: NPRS scale: 0/10 now but can increase to 6/10 Pain location: Neck, radiating to left arm and hand Pain description:  Sharp, can be "burning cold" Aggravating factors: Reaching, looking around, lifting, sleeping Relieving factors: Heat, moving out of aggravating positions    PRECAUTIONS: None  RED FLAGS: None     WEIGHT BEARING RESTRICTIONS: No  FALLS:  Has patient fallen in last 6 months? 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  DIAGNOSTIC FINDINGS:  CLINICAL DATA:  Initial evaluation for chronic neck pain, numbness in right arm and fingers.   EXAM: MRI CERVICAL SPINE WITHOUT CONTRAST   TECHNIQUE: Multiplanar, multisequence MR imaging of the cervical spine was performed. No intravenous contrast was administered.   COMPARISON:  None Available.   FINDINGS: Alignment: Straightening of the normal cervical lordosis. Trace degenerative retrolisthesis of C6 on C7.   Vertebrae: Vertebral body height maintained without acute or chronic fracture. Bone marrow signal intensity within normal limits. Small benign hemangioma noted involving the right inferior articular process of C3. No worrisome osseous lesions. Mild reactive endplate changes present about the C5-6 and C6-7 interspaces. No other abnormal marrow edema.   Cord: Normal signal and morphology.   Posterior Fossa, vertebral arteries, paraspinal tissues: Empty sella. Visualized brain and posterior fossa otherwise unremarkable. Craniocervical junction normal. Small retention cyst noted at the nasopharynx. Paraspinous soft tissues within normal limits.  Normal flow voids seen within the vertebral arteries bilaterally.   Disc levels:   C2-C3: Minimal uncovertebral spurring without significant disc bulge. Mild right-sided facet hypertrophy. No spinal stenosis. Foramina remain patent.   C3-C4: Disc bulge with bilateral uncovertebral spurring, slightly asymmetric to the right. Flattening of the ventral thecal sac without significant spinal stenosis. Foramina remain adequately patent.   C4-C5: Central to right paracentral disc protrusion mildly indents the ventral thecal sac (series 19, image 11). Minimal flattening of the ventral cord without cord signal changes. Thecal sac remains fairly capacious without significant stenosis. Superimposed mild uncovertebral and facet hypertrophy without significant foraminal encroachment.   C5-C6: Degenerative intervertebral disc space narrowing. Left paracentral disc osteophyte complex flattens and indents the ventral thecal sac (series 9, image 16). Mild cord flattening without cord signal changes. Mild spinal stenosis. Superimposed uncovertebral spurring with resultant mild right with moderate left C6 foraminal narrowing.   C6-C7: Degenerative intervertebral disc space narrowing. Left paracentral disc osteophyte complex flattens and indents the ventral thecal sac. Mild cord flattening without cord signal changes. Mild spinal stenosis. Superimposed uncovertebral spurring with resultant moderate bilateral C7 foraminal stenosis.   C7-T1: Negative interspace. Mild right-sided facet hypertrophy. No canal or foraminal stenosis.   T1-2: Small left paracentral disc protrusion indents the ventral thecal sac (series 8, image 28). Mild facet hypertrophy. No spinal stenosis. Foramina remain patent.   IMPRESSION: 1. Small central to right paracentral disc protrusion at C4-5 with minimal flattening of the ventral cord, but no significant spinal stenosis. 2. Left paracentral disc osteophyte complexes  at C5-6 and C6-7 with resultant mild spinal stenosis, with moderate left C6 and bilateral C7 foraminal narrowing. 3. Small left paracentral disc protrusion at T1-2 without significant stenosis. 4. Empty sella. While this finding is often incidental in nature and of no clinical significance, this can also be seen in the setting of idiopathic intracranial hypertension.     Electronically Signed   By: Rise Mu M.D.   On: 09/07/2022 04:10  PATIENT SURVEYS:  FOTO - deferred to next session  COGNITION: Overall cognitive status: Within functional limits for tasks assessed  SENSATION: Not tested  POSTURE: No Significant postural limitations    CERVICAL ROM:  Active ROM A/PROM (deg) eval  Flexion 34  Extension 28  Right lateral flexion 17  Left lateral flexion 12  Right rotation 60  Left rotation 60   (Blank rows = not tested)  UPPER EXTREMITY ROM:  Active/Passive ROM Right eval Left eval  Shoulder flexion    Shoulder extension    Shoulder abduction    Shoulder adduction    Shoulder extension    Shoulder internal rotation    Shoulder external rotation    Elbow flexion    Elbow extension    Wrist flexion    Wrist extension    Wrist ulnar deviation    Wrist radial deviation    Wrist pronation    Wrist supination     (Blank rows = not tested)  UPPER EXTREMITY MMT:  MMT Right eval Left eval  Shoulder flexion 4+ 4+  Shoulder extension    Shoulder abduction 4+ 4+  Shoulder adduction    Shoulder extension    Shoulder internal rotation    Shoulder external rotation    Middle trapezius 4 4  Lower trapezius 4- 3+  Elbow flexion    Elbow extension    Wrist flexion    Wrist extension    Wrist ulnar deviation    Wrist radial deviation    Wrist pronation    Wrist supination    Grip strength     (Blank rows = not tested)  CERVICAL SPECIAL TESTS:  Upper limb tension test (ULTT): Positive, Spurling's test: Positive, and Distraction test:  Positive   TODAY'S TREATMENT:                                                                                                                              DATE:   11/28/22 ThereEx: Median nerve glides 2x30s Resisted Y's with yellow TB 2x10 per arm    PATIENT EDUCATION:  Education details: Pt educated on results of tests and course of PT Person educated: Patient Education method: Explanation Education comprehension: verbalized understanding  HOME EXERCISE PROGRAM: Median nerve glides - 3x30s - 1x/day Resisted y's - 2x10 per side - 3-4x/week   ASSESSMENT:  CLINICAL IMPRESSION:  Patient is a 42 y.o. AA female who was seen today for physical therapy evaluation and treatment for left sided neck pain. Positive Spurling's, ULLT, and distraction tests all help to rule in left sided radiculopathy as a pain generator. Pt deficits include decreased neck range of motion, decreased periscapular strength bilaterally, left sided neck pain radiating to left hand, and impaired sensation. These limit her ability to lift, reach overhead, and look around, which in turn restrict her ability to drive, do her hair, and participate fully at her job. PT would be beneficial to address the aforementioned deficits and help her to work and perform ADL's  OBJECTIVE IMPAIRMENTS: decreased ROM, decreased strength, and pain.   ACTIVITY LIMITATIONS: carrying, lifting, standing, sleeping, reach over head, and hygiene/grooming  PARTICIPATION LIMITATIONS: meal prep, cleaning, driving,  and occupation  PERSONAL FACTORS: Time since onset of injury/illness/exacerbation are also affecting patient's functional outcome.   REHAB POTENTIAL: Fair pain has persisted for 6 months and persists despite steroid injections  CLINICAL DECISION MAKING: Stable/uncomplicated  EVALUATION COMPLEXITY: Low   GOALS: Goals reviewed with patient? No  SHORT TERM GOALS: Target date: 12/12/2022    Patient will demonstrate independent  understanding of HEP to be able effectively manage underlying MSK condition.  Baseline: NOT TESTED Goal status: INITIAL    LONG TERM GOALS: Target date: 02/06/2023  Pt will increase neck range of motion for flexion, extension, and bilateral side bending by 10 degrees or more to assist with performing ADL's and job related tasks. Baseline:  Flexion 34  Extension 28  Right lateral flexion 17  Left lateral flexion 12   Goal status: INITIAL  2.  Pt will increase periscapular strength by 1/3 (i.e. 4 to 4+) to assist with stability while lifting and reaching activities at work.  Baseline:  Shoulder flexion 4+ 4+  Shoulder abduction 4+ 4+  Middle trapezius 4 4  Lower trapezius 4- 3+   Goal status: INITIAL  3.  Patient will have improved function and activity level as evidenced by an increase in FOTO score by 10 points or more.   Baseline: NOT TESTED Goal status: INITIAL   PLAN:  PT FREQUENCY: 1-2x/week  PT DURATION: 10 weeks  PLANNED INTERVENTIONS: 97164- PT Re-evaluation, 97110-Therapeutic exercises, 97530- Therapeutic activity, 97112- Neuromuscular re-education, 97535- Self Care, 16109- Manual therapy, Dry Needling, Joint mobilization, and Moist heat  PLAN FOR NEXT SESSION: Vertebral artery testing, progress periscapular and shoulder strengthening    Arcelia Jew, Student-PT 11/28/2022, 6:26 PM

## 2022-12-18 ENCOUNTER — Ambulatory Visit: Payer: Commercial Managed Care - PPO | Admitting: Physical Therapy

## 2022-12-18 DIAGNOSIS — M542 Cervicalgia: Secondary | ICD-10-CM | POA: Diagnosis not present

## 2022-12-18 NOTE — Therapy (Signed)
OUTPATIENT PHYSICAL THERAPY CERVICAL TREATMENT   Patient Name: Robyn Gordon MRN: [id number] DOB:01/13/1981, 42 y.o., female Today's Date: 12/18/2022  END OF SESSION:  PT End of Session - 12/18/22 1732     Visit Number 2    Number of Visits 20    Date for PT Re-Evaluation 02/06/23    Authorization Type UHC/UMR 2024    Authorization Time Period 10    Authorization - Visit Number 2    Authorization - Number of Visits 20    Progress Note Due on Visit 10    PT Start Time 1730    PT Stop Time 1815    PT Time Calculation (min) 45 min    Activity Tolerance Patient tolerated treatment well    Behavior During Therapy WFL for tasks assessed/performed              Past Medical History:  Diagnosis Date   Hypertension    Past Surgical History:  Procedure Laterality Date   TUBAL LIGATION     There are no problems to display for this patient.   PCP: Carren Rang, PA-C   REFERRING PROVIDER: Drake Leach, PA-C   REFERRING DIAG:  [phone] (ICD-10-CM) - Cervical radiculopathy M47.812 (ICD-10-CM) - Cervical spondylosis M54.2 (ICD-10-CM) - Neck pain    THERAPY DIAG:  No diagnosis found.  Rationale for Evaluation and Treatment: Rehabilitation  ONSET DATE: ~6 months  SUBJECTIVE:                                                                                                                                                                                                         SUBJECTIVE STATEMENT: Pt report that her numbness and tingling going down her left arm has decreased since regularly taking her Gabapentin.   Hand dominance: Right  PERTINENT HISTORY:  Left sided neck pain with pain, numbness and tingling on left side. Pain started as neck pain with temperature sensativity. Had a steroid injection which helped, but numbness has started to come back. Head position affects symptoms down arm.  PAIN:  Are you having pain? Yes: NPRS scale: 0/10 now but can  increase to 6/10 Pain location: Neck, radiating to left arm and hand Pain description: Sharp, can be "burning cold" Aggravating factors: Reaching, looking around, lifting, sleeping Relieving factors: Heat, moving out of aggravating positions    PRECAUTIONS: None  RED FLAGS: None     WEIGHT BEARING RESTRICTIONS: No  FALLS:  Has patient fallen in last 6 months? 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  DIAGNOSTIC FINDINGS:  CLINICAL DATA:  Initial evaluation for chronic neck pain, numbness in right arm and fingers.   EXAM: MRI CERVICAL SPINE WITHOUT CONTRAST   TECHNIQUE: Multiplanar, multisequence MR imaging of the cervical spine was performed. No intravenous contrast was administered.   COMPARISON:  None Available.   FINDINGS: Alignment: Straightening of the normal cervical lordosis. Trace degenerative retrolisthesis of C6 on C7.   Vertebrae: Vertebral body height maintained without acute or chronic fracture. Bone marrow signal intensity within normal limits. Small benign hemangioma noted involving the right inferior articular process of C3. No worrisome osseous lesions. Mild reactive endplate changes present about the C5-6 and C6-7 interspaces. No other abnormal marrow edema.   Cord: Normal signal and morphology.   Posterior Fossa, vertebral arteries, paraspinal tissues: Empty sella. Visualized brain and posterior fossa otherwise unremarkable. Craniocervical junction normal. Small  retention cyst noted at the nasopharynx. Paraspinous soft tissues within normal limits. Normal flow voids seen within the vertebral arteries bilaterally.   Disc levels:   C2-C3: Minimal uncovertebral spurring without significant disc bulge. Mild right-sided facet hypertrophy. No spinal stenosis. Foramina remain patent.   C3-C4: Disc bulge with bilateral uncovertebral spurring, slightly asymmetric to the right. Flattening of the ventral thecal sac without significant spinal stenosis. Foramina remain adequately patent.   C4-C5: Central to right paracentral disc protrusion mildly indents the ventral thecal sac (series 19, image 11). Minimal flattening of the ventral cord without cord signal changes. Thecal sac remains fairly capacious without significant stenosis. Superimposed mild uncovertebral and facet hypertrophy without significant foraminal encroachment.   C5-C6: Degenerative intervertebral disc space narrowing. Left paracentral disc osteophyte complex flattens and indents the ventral thecal sac (series 9, image 16). Mild cord flattening without cord signal changes. Mild spinal stenosis. Superimposed uncovertebral spurring with resultant mild right with moderate left C6 foraminal narrowing.   C6-C7: Degenerative intervertebral disc space narrowing. Left paracentral disc osteophyte complex flattens and indents the ventral thecal sac. Mild cord flattening without cord signal changes. Mild spinal stenosis. Superimposed uncovertebral spurring with resultant moderate bilateral C7 foraminal stenosis.   C7-T1: Negative interspace. Mild right-sided facet hypertrophy. No canal or foraminal stenosis.   T1-2: Small left paracentral disc protrusion indents the ventral thecal sac (series 8, image 28). Mild facet hypertrophy. No spinal stenosis. Foramina remain patent.   IMPRESSION: 1. Small central to right paracentral disc protrusion at C4-5 with minimal flattening of the ventral  cord, but no significant spinal stenosis. 2. Left paracentral disc osteophyte complexes at C5-6 and C6-7 with resultant mild spinal stenosis, with moderate left C6 and bilateral C7 foraminal narrowing. 3. Small left paracentral disc protrusion at T1-2 without significant stenosis. 4. Empty sella. While this finding is often incidental in nature and of no clinical significance, this can also be seen in the setting of idiopathic intracranial hypertension.     Electronically Signed   By: Rise Mu M.D.   On: 09/07/2022 04:10  PATIENT SURVEYS:    COGNITION: Overall cognitive status: Within functional limits for tasks assessed  SENSATION: Not tested  POSTURE: No  Significant postural limitations    CERVICAL ROM:   Active ROM A/PROM (deg) eval  Flexion 34  Extension 28  Right lateral flexion 17  Left lateral flexion 12  Right rotation 60  Left rotation 60   (Blank rows = not tested)  UPPER EXTREMITY ROM:  Active/Passive ROM Right eval Left eval  Shoulder flexion    Shoulder extension    Shoulder abduction    Shoulder adduction    Shoulder extension    Shoulder internal rotation    Shoulder external rotation    Elbow flexion    Elbow extension    Wrist flexion    Wrist extension    Wrist ulnar deviation    Wrist radial deviation    Wrist pronation    Wrist supination     (Blank rows = not tested)  UPPER EXTREMITY MMT:  MMT Right eval Left eval  Shoulder flexion 4+ 4+  Shoulder extension    Shoulder abduction 4+ 4+  Shoulder adduction    Shoulder extension    Shoulder internal rotation    Shoulder external rotation    Middle trapezius 4 4  Lower trapezius 4- 3+  Elbow flexion    Elbow extension    Wrist flexion    Wrist extension    Wrist ulnar deviation    Wrist radial deviation    Wrist pronation    Wrist supination    Grip strength     (Blank rows = not tested)  CERVICAL SPECIAL TESTS:  Upper limb tension test (ULTT):  Positive, Spurling's test: Positive, and Distraction test: Positive   TODAY'S TREATMENT:                                                                                                                              DATE:   12/18/22:  THEREX UBE 2.5 forward and 2.5 backward with resistance of 2.  FOTO: 41 with target off 55  Vertebrobasilar Insufficiency Test: Negative Bilateral  Cervical PROM  -Lateral Flexion R/L 45 deg/45 deg  -Flexion 45 deg  -Extension 45 deg    Upper Trap Stretch 3 x 30 sec   Median Nerve Glide 1 x 10  -Pt reports increased cervical pain  Median nerve glide with stationary neck and elbow and wrist movement 1 x 10  11/28/22 ThereEx: Median nerve glides 2x30s Resisted Y's with yellow TB 2x10 per arm    PATIENT EDUCATION:  Education details: Pt educated on results of tests and course of PT Person educated: Patient Education method: Explanation Education comprehension: verbalized understanding  HOME EXERCISE PROGRAM:  Access Code: [email or web address] URL: [email or web address] Date: 12/18/2022 Prepared by: Ellin Goodie  Exercises - Seated Upper Trapezius Stretch  - 3-4 x weekly - 3 reps - 30 sec  hold - Standing Shoulder Single Arm PNF D2 Flexion with Resistance  - 3-4 x weekly - 3 sets - 10 reps - Seated Median Nerve Glide  - 3-4 x weekly - 10  reps  ASSESSMENT:  CLINICAL IMPRESSION: Pt does not show VBI and this does not account for prior subjective report of dizziness with cervical extension. Median nerve glides modified to include decreased cervical ROM due to increased pain. She was able to perform all other exercises without an increase in her neck pain. She will continue to benefit from skilled PT to improve cervical ROM and strength to perform job related duties like lifting objects at work and turning head to navigate environment while driving.     OBJECTIVE IMPAIRMENTS: decreased ROM, decreased strength, and pain.   ACTIVITY  LIMITATIONS: carrying, lifting, standing, sleeping, reach over head, and hygiene/grooming  PARTICIPATION LIMITATIONS: meal prep, cleaning, driving, and occupation  PERSONAL FACTORS: Time since onset of injury/illness/exacerbation are also affecting patient's functional outcome.   REHAB POTENTIAL: Fair pain has persisted for 6 months and persists despite steroid injections  CLINICAL DECISION MAKING: Stable/uncomplicated  EVALUATION COMPLEXITY: Low   GOALS: Goals reviewed with patient? No  SHORT TERM GOALS: Target date: 12/12/2022    Patient will demonstrate independent understanding of HEP to be able effectively manage underlying MSK condition.  Baseline: NT Goal status: ONGOING     LONG TERM GOALS: Target date: 02/06/2023  Pt will increase neck range of motion for flexion, extension, and bilateral side bending by 10 degrees or more to assist with performing ADL's and job related tasks. Baseline:  Flexion 34  Extension 28  Right lateral flexion 17  Left lateral flexion 12   Goal status: ONGOING   2.  Pt will increase periscapular strength by 1/3 (i.e. 4 to 4+) to assist with stability while lifting and reaching activities at work.  Baseline:  Shoulder flexion 4+ 4+  Shoulder abduction 4+ 4+  Middle trapezius 4 4  Lower trapezius 4- 3+   Goal status: ONGOING   3.  Patient will have improved function and activity level as evidenced by an increase in FOTO score by 10 points or more.   Baseline: 41 with target of 55  Goal status: ONGOING    PLAN:  PT FREQUENCY: 1-2x/week  PT DURATION: 10 weeks  PLANNED INTERVENTIONS: 97164- PT Re-evaluation, 97110-Therapeutic exercises, 97530- Therapeutic activity, 97112- Neuromuscular re-education, 97535- Self Care, 65784- Manual therapy, Dry Needling, Joint mobilization, and Moist heat  PLAN FOR NEXT SESSION: Continue with periscapular and shoulder strengthening: seated rows and horizontal abduction. Measure cervical rotation  PROM. Continue with cervical ROM exercises: cervical SNAGS   Ellin Goodie PT, DPT  [facility] [address], [place], [address] Phone: [phone]   Fax:  [phone]

## 2022-12-25 ENCOUNTER — Encounter: Payer: Self-pay | Admitting: Physical Therapy

## 2022-12-25 ENCOUNTER — Ambulatory Visit: Payer: Commercial Managed Care - PPO | Attending: Orthopedic Surgery | Admitting: Physical Therapy

## 2022-12-25 DIAGNOSIS — M542 Cervicalgia: Secondary | ICD-10-CM | POA: Diagnosis present

## 2022-12-25 NOTE — Therapy (Unsigned)
OUTPATIENT PHYSICAL THERAPY CERVICAL TREATMENT   Patient Name: Robyn Gordon MRN: [id number] DOB:[birth year], 42 y.o., female Today's Date: 12/25/2022  END OF SESSION:  PT End of Session - 12/25/22 1735     Visit Number 3    Number of Visits 20    Date for PT Re-Evaluation 02/06/23    Authorization Type U[employer]/UMR [id number]    Authorization Time Period 10    Authorization - Visit Number 3    Authorization - Number of Visits 20    Progress Note Due on Visit 10    PT Start Time 1730    PT Stop Time 1815    PT Time Calculation (min) 45 min    Activity Tolerance Patient tolerated treatment well    Behavior During Therapy WFL for tasks assessed/performed               Past Medical History:  Diagnosis Date   Hypertension    Past Surgical History:  Procedure Laterality Date   TUBAL LIGATION     There are no problems to display for this patient.   PCP: Carren Rang, PA-C   REFERRING PROVIDER: Drake Leach, PA-C   REFERRING DIAG:  [phone] (ICD-10-CM) - Cervical radiculopathy M47.812 (ICD-10-CM) - Cervical spondylosis M54.2 (ICD-10-CM) - Neck pain    THERAPY DIAG:  Neck pain on left side  Rationale for Evaluation and Treatment: Rehabilitation  ONSET DATE: ~6 months  SUBJECTIVE:                                                                                                                                                                                                         SUBJECTIVE STATEMENT: Pt states that she felt n/t down her right arm especially when laying down and extending her neck. She has purchased a number of neck pillows, but none of these have really helped.  Hand dominance: Right  PERTINENT HISTORY:  Left sided neck pain with pain, numbness and tingling on left side. Pain started as neck pain with temperature sensativity. Had a steroid injection which helped, but numbness has started to come back. Head position affects symptoms down  arm.  PAIN:  Are you having pain? Yes: NPRS scale: 3/10  Pain location: Neck, radiating to left arm and hand Pain description: Sharp, can be "burning cold" Aggravating factors: Reaching, looking around, lifting, sleeping Relieving factors: Heat, moving out of aggravating positions    PRECAUTIONS: None  RED FLAGS: None     WEIGHT BEARING RESTRICTIONS: No  FALLS:  Has patient fallen in last 6 months? No  LIVING ENVIRONMENT: Lives with: lives with their family Lives in: House/apartment Stairs: Yes: Internal: 3 steps; none and External: 10 steps; on right going up Has following equipment at home: None  OCCUPATION: [employer]  PLOF: Independent  PATIENT GOALS: Pt wants to reduce pain and perform job related activities and ADL's with less pain and return to the gym and sleep comfortably   NEXT MD VISIT: maybe next month  OBJECTIVE:  Note: Objective measures were completed at Evaluation unless otherwise noted.  DIAGNOSTIC FINDINGS:  CLINICAL DATA:  Initial evaluation for chronic neck pain, numbness in right arm and fingers.   EXAM: MRI CERVICAL SPINE WITHOUT CONTRAST   TECHNIQUE: Multiplanar, multisequence MR imaging of the cervical spine was performed. No intravenous contrast was administered.   COMPARISON:  None Available.   FINDINGS: Alignment: Straightening of the normal cervical lordosis. Trace degenerative retrolisthesis of C6 on C7.   Vertebrae: Vertebral body height maintained without acute or chronic fracture. Bone marrow signal intensity within normal limits. Small benign hemangioma noted involving the right inferior articular process of C3. No worrisome osseous lesions. Mild reactive endplate changes present about the C5-6 and C6-7 interspaces. No other abnormal marrow edema.   Cord: Normal signal and morphology.   Posterior Fossa, vertebral arteries, paraspinal tissues: Empty sella. Visualized brain and posterior fossa otherwise  unremarkable. Craniocervical junction normal. Small retention cyst noted at the nasopharynx. Paraspinous soft tissues within normal limits. Normal flow voids seen within the vertebral arteries bilaterally.   Disc levels:   C2-C3: Minimal uncovertebral spurring without significant disc bulge. Mild right-sided facet hypertrophy. No spinal stenosis. Foramina remain patent.   C3-C4: Disc bulge with bilateral uncovertebral spurring, slightly asymmetric to the right. Flattening of the ventral thecal sac without significant spinal stenosis. Foramina remain adequately patent.   C4-C5: Central to right paracentral disc protrusion mildly indents the ventral thecal sac (series 19, image 11). Minimal flattening of the ventral cord without cord signal changes. Thecal sac remains fairly capacious without significant stenosis. Superimposed mild uncovertebral and facet hypertrophy without significant foraminal encroachment.   C5-C6: Degenerative intervertebral disc space narrowing. Left paracentral disc osteophyte complex flattens and indents the ventral thecal sac (series 9, image 16). Mild cord flattening without cord signal changes. Mild spinal stenosis. Superimposed uncovertebral spurring with resultant mild right with moderate left C6 foraminal narrowing.   C6-C7: Degenerative intervertebral disc space narrowing. Left paracentral disc osteophyte complex flattens and indents the ventral thecal sac. Mild cord flattening without cord signal changes. Mild spinal stenosis. Superimposed uncovertebral spurring with resultant moderate bilateral C7 foraminal stenosis.   C7-T1: Negative interspace. Mild right-sided facet hypertrophy. No canal or foraminal stenosis.   T1-2: Small left paracentral disc protrusion indents the ventral thecal sac (series 8, image 28). Mild facet hypertrophy. No spinal stenosis. Foramina remain patent.   IMPRESSION: 1. Small central to right paracentral disc  protrusion at C4-5 with minimal flattening of the ventral cord, but no significant spinal stenosis. 2. Left paracentral disc osteophyte complexes at C5-6 and C6-7 with resultant mild spinal stenosis, with moderate left C6 and bilateral C7 foraminal narrowing. 3. Small left paracentral disc protrusion at T1-2 without significant stenosis. 4. Empty sella. While this finding is often incidental in nature and of no clinical significance, this can also be seen in the setting of idiopathic intracranial hypertension.     Electronically Signed   By: Rise Mu M.D.   On: 09/07/2022 04:10  PATIENT SURVEYS:    COGNITION: Overall cognitive status: Within functional  limits for tasks assessed  SENSATION: Not tested  POSTURE: No Significant postural limitations    CERVICAL ROM:   Active ROM A/PROM (deg) eval  Flexion 34  Extension 28  Right lateral flexion 17  Left lateral flexion 12  Right rotation 60  Left rotation 60   (Blank rows = not tested)  UPPER EXTREMITY ROM:  Insert SmartText    Active/Passive ROM Right eval Left eval  Shoulder flexion    Shoulder extension    Shoulder abduction    Shoulder adduction    Shoulder extension    Shoulder internal rotation    Shoulder external rotation    Elbow flexion    Elbow extension    Wrist flexion    Wrist extension    Wrist ulnar deviation    Wrist radial deviation    Wrist pronation    Wrist supination     (Blank rows = not tested)  UPPER EXTREMITY MMT:  MMT Right eval Left eval  Shoulder flexion 4+ 4+  Shoulder extension    Shoulder abduction 4+ 4+  Shoulder adduction    Shoulder extension    Shoulder internal rotation    Shoulder external rotation    Middle trapezius 4 4  Lower trapezius 4- 3+  Elbow flexion    Elbow extension    Wrist flexion    Wrist extension    Wrist ulnar deviation    Wrist radial deviation    Wrist pronation    Wrist supination    Grip strength     (Blank  rows = not tested)  CERVICAL SPECIAL TESTS:  Upper limb tension test (ULTT): Positive, Spurling's test: Positive, and Distraction test: Positive   TODAY'S TREATMENT:                                                                                                                              DATE:   12/25/22: UBE 2.5 forward and 2.5 backward with resistance of 3 for 5 min  Cervical SNAG Self Mobilizations 2 x 10  -mod VC for hand placement  Seated Chin Tucks 1 x 10  -Pt reports increased left sided cervical radicular pain  Supine Chin Tucks 1 x 10  D2 PNF Flexion and Extension with green band 2 x 10  Left Median Nerve glide with elbow and wrist extension and neck side bend  -mod VC for how to sequence exercise.   12/18/22:  THEREX UBE 2.5 forward and 2.5 backward with resistance of 2.  FOTO: 41 with target off 55  Vertebrobasilar Insufficiency Test: Negative Bilateral  Cervical PROM  -Lateral Flexion R/L 45 deg/45 deg  -Flexion 45 deg  -Extension 45 deg    Upper Trap Stretch 3 x 30 sec   Median Nerve Glide 1 x 10  -Pt reports increased cervical pain  Median nerve glide with stationary neck and elbow and wrist movement 1 x 10  11/28/22 ThereEx: Median nerve glides 2x30s Resisted Y's  with yellow TB 2x10 per arm    PATIENT EDUCATION:  Education details: Pt educated on results of tests and course of PT Person educated: Patient Education method: Explanation Education comprehension: verbalized understanding  HOME EXERCISE PROGRAM:   Access Code: [email or web address] URL: [email or web address] Date: 12/25/2022 Prepared by: Ellin Goodie  Exercises - Seated Assisted Cervical Rotation with Towel  - 1 x daily - 10 reps - 2 sec  hold - Seated Upper Trapezius Stretch  - 3-4 x weekly - 3 reps - 30 sec  hold - Standing Shoulder Single Arm PNF D2 Flexion with Resistance  - 3-4 x weekly - 3 sets - 10 reps - Median Nerve Flossing - Tray  - 1 x daily - 3 sets - 60s  hold  ASSESSMENT:  CLINICAL IMPRESSION: Pt continues display signs and symptoms of cervical stenosis with resulting cervical radiculopathy with increased pain and numbness and tingling with extension. N/t do respond with median nerve glides with patient's response improved with added cervical motion. She was able to progress periscapular strength with ability to perform D2 shoulder flexion and extension exercise with increased resistance. Pt will continue to benefit from skilled PT to improve cervical ROM and periscapular strength to decrease neck pain in order to improve overall cervical function.   OBJECTIVE IMPAIRMENTS: decreased ROM, decreased strength, and pain.   ACTIVITY LIMITATIONS: carrying, lifting, standing, sleeping, reach over head, and hygiene/grooming  PARTICIPATION LIMITATIONS: meal prep, cleaning, driving, and occupation  PERSONAL FACTORS: Time since onset of injury/illness/exacerbation are also affecting patient's functional outcome.   REHAB POTENTIAL: Fair pain has persisted for 6 months and persists despite steroid injections  CLINICAL DECISION MAKING: Stable/uncomplicated  EVALUATION COMPLEXITY: Low   GOALS: Goals reviewed with patient? No  SHORT TERM GOALS: Target date: 12/12/2022    Patient will demonstrate independent understanding of HEP to be able effectively manage underlying MSK condition.  Baseline: NT Goal status: ONGOING     LONG TERM GOALS: Target date: 02/06/2023  Pt will increase neck range of motion for flexion, extension, and bilateral side bending by 10 degrees or more to assist with performing ADL's and job related tasks. Baseline:  Flexion 34  Extension 28  Right lateral flexion 17  Left lateral flexion 12   Goal status: ONGOING   2.  Pt will increase periscapular strength by 1/3 (i.e. 4 to 4+) to assist with stability while lifting and reaching activities at work.  Baseline:  Shoulder flexion 4+ 4+  Shoulder abduction 4+ 4+   Middle trapezius 4 4  Lower trapezius 4- 3+   Goal status: ONGOING   3.  Patient will have improved function and activity level as evidenced by an increase in FOTO score by 10 points or more.   Baseline: 41 with target of 55  Goal status: ONGOING    PLAN:  PT FREQUENCY: 1-2x/week  PT DURATION: 10 weeks  PLANNED INTERVENTIONS: 97164- PT Re-evaluation, 97110-Therapeutic exercises, 97530- Therapeutic activity, 97112- Neuromuscular re-education, 97535- Self Care, 28413- Manual therapy, Dry Needling, Joint mobilization, and Moist heat  PLAN FOR NEXT SESSION: Continue with periscapular and shoulder strengthening: seated rows and horizontal abduction. Measure cervical rotation PROM.  Ellin Goodie PT, DPT  [facility] [address] [place], [address] Phone: [phone]   Fax:  [phone]

## 2022-12-31 ENCOUNTER — Encounter: Payer: Commercial Managed Care - PPO | Admitting: Physical Therapy

## 2023-01-02 ENCOUNTER — Ambulatory Visit: Payer: Commercial Managed Care - PPO | Admitting: Physical Therapy

## 2023-01-06 ENCOUNTER — Ambulatory Visit: Payer: Commercial Managed Care - PPO | Admitting: Physical Therapy

## 2023-01-06 DIAGNOSIS — M542 Cervicalgia: Secondary | ICD-10-CM

## 2023-01-06 NOTE — Therapy (Signed)
OUTPATIENT PHYSICAL THERAPY CERVICAL TREATMENT   Patient Name: Robyn Gordon MRN: [id number] DOB:02-Feb-1980, 42 y.o., female Today's Date: 01/06/2023  END OF SESSION:  PT End of Session - 01/06/23 1821     Visit Number 5    Number of Visits 20    Date for PT Re-Evaluation 02/06/23    Authorization Type U[employer]/UMR [id number]    Authorization Time Period 10    Authorization - Visit Number 5    Authorization - Number of Visits 20    Progress Note Due on Visit 10    PT Start Time 1820    PT Stop Time 1900    PT Time Calculation (min) 40 min    Activity Tolerance Patient tolerated treatment well    Behavior During Therapy WFL for tasks assessed/performed               Past Medical History:  Diagnosis Date   Hypertension    Past Surgical History:  Procedure Laterality Date   TUBAL LIGATION     There are no active problems to display for this patient.   PCP: Carren Rang, PA-C   REFERRING PROVIDER: Drake Leach, PA-C   REFERRING DIAG:  [phone] (ICD-10-CM) - Cervical radiculopathy M47.812 (ICD-10-CM) - Cervical spondylosis M54.2 (ICD-10-CM) - Neck pain    THERAPY DIAG:  Neck pain on left side  Rationale for Evaluation and Treatment: Rehabilitation  ONSET DATE: ~6 months  SUBJECTIVE:                                                                                                                                                                                                         SUBJECTIVE STATEMENT: Pt reports that she feels that her neck pain feels about the same as it did when she started. She continues to experience numbness and tingling when waking up in the morning and neck pain after work. Hand dominance: Right  PERTINENT HISTORY:  Left sided neck pain with pain, numbness and tingling on left side. Pain started as neck pain with temperature sensativity. Had a steroid injection which helped, but numbness has started to come back. Head position  affects symptoms down arm.  PAIN:  Are you having pain? Yes: NPRS scale: 3/10  Pain location: Neck, radiating to left arm and hand Pain description: Sharp, can be "burning cold" Aggravating factors: Reaching, looking around, lifting, sleeping Relieving factors: Heat, moving out of aggravating positions    PRECAUTIONS: None  RED FLAGS: None     WEIGHT BEARING RESTRICTIONS: No  FALLS:  Has patient fallen in last  6 months? No  LIVING ENVIRONMENT: Lives with: lives with their family Lives in: House/apartment Stairs: Yes: Internal: 3 steps; none and External: 10 steps; on right going up Has following equipment at home: None  OCCUPATION: [employer]  PLOF: Independent  PATIENT GOALS: Pt wants to reduce pain and perform job related activities and ADL's with less pain and return to the gym and sleep comfortably   NEXT MD VISIT: maybe next month  OBJECTIVE:  Note: Objective measures were completed at Evaluation unless otherwise noted.  DIAGNOSTIC FINDINGS:  CLINICAL DATA:  Initial evaluation for chronic neck pain, numbness in right arm and fingers.   EXAM: MRI CERVICAL SPINE WITHOUT CONTRAST   TECHNIQUE: Multiplanar, multisequence MR imaging of the cervical spine was performed. No intravenous contrast was administered.   COMPARISON:  None Available.   FINDINGS: Alignment: Straightening of the normal cervical lordosis. Trace degenerative retrolisthesis of C6 on C7.   Vertebrae: Vertebral body height maintained without acute or chronic fracture. Bone marrow signal intensity within normal limits. Small benign hemangioma noted involving the right inferior articular process of C3. No worrisome osseous lesions. Mild reactive endplate changes present about the C5-6 and C6-7 interspaces. No other abnormal marrow edema.   Cord: Normal signal and morphology.   Posterior Fossa, vertebral arteries, paraspinal tissues: Empty sella. Visualized brain and posterior fossa  otherwise unremarkable. Craniocervical junction normal. Small retention cyst noted at the nasopharynx. Paraspinous soft tissues within normal limits. Normal flow voids seen within the vertebral arteries bilaterally.   Disc levels:   C2-C3: Minimal uncovertebral spurring without significant disc bulge. Mild right-sided facet hypertrophy. No spinal stenosis. Foramina remain patent.   C3-C4: Disc bulge with bilateral uncovertebral spurring, slightly asymmetric to the right. Flattening of the ventral thecal sac without significant spinal stenosis. Foramina remain adequately patent.   C4-C5: Central to right paracentral disc protrusion mildly indents the ventral thecal sac (series 19, image 11). Minimal flattening of the ventral cord without cord signal changes. Thecal sac remains fairly capacious without significant stenosis. Superimposed mild uncovertebral and facet hypertrophy without significant foraminal encroachment.   C5-C6: Degenerative intervertebral disc space narrowing. Left paracentral disc osteophyte complex flattens and indents the ventral thecal sac (series 9, image 16). Mild cord flattening without cord signal changes. Mild spinal stenosis. Superimposed uncovertebral spurring with resultant mild right with moderate left C6 foraminal narrowing.   C6-C7: Degenerative intervertebral disc space narrowing. Left paracentral disc osteophyte complex flattens and indents the ventral thecal sac. Mild cord flattening without cord signal changes. Mild spinal stenosis. Superimposed uncovertebral spurring with resultant moderate bilateral C7 foraminal stenosis.   C7-T1: Negative interspace. Mild right-sided facet hypertrophy. No canal or foraminal stenosis.   T1-2: Small left paracentral disc protrusion indents the ventral thecal sac (series 8, image 28). Mild facet hypertrophy. No spinal stenosis. Foramina remain patent.   IMPRESSION: 1. Small central to right paracentral  disc protrusion at C4-5 with minimal flattening of the ventral cord, but no significant spinal stenosis. 2. Left paracentral disc osteophyte complexes at C5-6 and C6-7 with resultant mild spinal stenosis, with moderate left C6 and bilateral C7 foraminal narrowing. 3. Small left paracentral disc protrusion at T1-2 without significant stenosis. 4. Empty sella. While this finding is often incidental in nature and of no clinical significance, this can also be seen in the setting of idiopathic intracranial hypertension.     Electronically Signed   By: Rise Mu M.D.   On: 09/07/2022 04:10  PATIENT SURVEYS:    COGNITION: Overall  cognitive status: Within functional limits for tasks assessed  SENSATION: Not tested  POSTURE: No Significant postural limitations    CERVICAL ROM:   Active ROM A/PROM (deg) eval  Flexion 34  Extension 28  Right lateral flexion 17  Left lateral flexion 12  Right rotation 60  Left rotation 60   (Blank rows = not tested)  UPPER EXTREMITY ROM:  Insert SmartText    Active/Passive ROM Right eval Left eval  Shoulder flexion    Shoulder extension    Shoulder abduction    Shoulder adduction    Shoulder extension    Shoulder internal rotation    Shoulder external rotation    Elbow flexion    Elbow extension    Wrist flexion    Wrist extension    Wrist ulnar deviation    Wrist radial deviation    Wrist pronation    Wrist supination     (Blank rows = not tested)  UPPER EXTREMITY MMT:  MMT Right eval Left eval  Shoulder flexion 4+ 4+  Shoulder extension    Shoulder abduction 4+ 4+  Shoulder adduction    Shoulder extension    Shoulder internal rotation    Shoulder external rotation    Middle trapezius 4 4  Lower trapezius 4- 3+  Elbow flexion    Elbow extension    Wrist flexion    Wrist extension    Wrist ulnar deviation    Wrist radial deviation    Wrist pronation    Wrist supination    Grip strength      (Blank rows = not tested)  CERVICAL SPECIAL TESTS:  Upper limb tension test (ULTT): Positive, Spurling's test: Positive, and Distraction test: Positive   TODAY'S TREATMENT:                                                                                                                              DATE:   01/06/23: UBE 2.5 forward and 2.5 backward with resistance of 3 for 5 min  Shoulder Horizontal Abduction AROM 1 x 10  Shoulder Horizontal Abduction with yellow band 1 x 10   Cerrvical ROM   AROM PROM  Flexion 35 45  Extension 20  45  Right lateral flexion 20 45  Left lateral flexion 20 45   MANUAL  Suboccipital release  Education on suboccipital release using a tennis ball    12/25/22: UBE 2.5 forward and 2.5 backward with resistance of 3 for 5 min  Cervical SNAG Self Mobilizations 2 x 10  -mod VC for hand placement  Seated Chin Tucks 1 x 10  -Pt reports increased left sided cervical radicular pain  Supine Chin Tucks 1 x 10  D2 PNF Flexion and Extension with green band 2 x 10  Left Median Nerve glide with elbow and wrist extension and neck side bend  -mod VC for how to sequence exercise.   12/18/22:  THEREX UBE 2.5 forward and 2.5 backward  with resistance of 2.  FOTO: 41 with target off 55  Vertebrobasilar Insufficiency Test: Negative Bilateral  Cervical PROM  -Lateral Flexion R/L 45 deg/45 deg  -Flexion 45 deg  -Extension 45 deg    Upper Trap Stretch 3 x 30 sec   Median Nerve Glide 1 x 10  -Pt reports increased cervical pain  Median nerve glide with stationary neck and elbow and wrist movement 1 x 10    PATIENT EDUCATION:  Education details: Pt educated on results of tests and course of PT Person educated: Patient Education method: Explanation Education comprehension: verbalized understanding  HOME EXERCISE PROGRAM:   Access Code: [email or web address] URL: [email or web address] Date: 01/06/2023 Prepared by: Ellin Goodie  Exercises - Seated  Upper Trapezius Stretch  - 3-4 x weekly - 3 reps - 30 sec  hold - Supine Suboccipital Release with Tennis Balls  - 1 x daily - 3 reps - 5 min  hold - Seated Assisted Cervical Rotation with Towel  - 1 x daily - 10 reps - 2 sec  hold - Standing Shoulder Single Arm PNF D2 Flexion with Resistance  - 3-4 x weekly - 3 sets - 10 reps - Standing Shoulder Horizontal Abduction with Resistance  - 3-4 x weekly - 3 sets - 10 reps  ASSESSMENT:  CLINICAL IMPRESSION: Pt demonstrates ongoing cervical ROM restrictions with PROM >AROM indicating likely muscular restriction. PT shows increased muscular tension along with tenderness in suboccipitals. Pt appears to be in cervical extension when laying on pillow when placed on head. PT instructed pt to utilize cervical pillow to reduce cervical extension and to reduce symptoms. She will continue to benefit from skilled PT to improve cervical ROM and periscapular strength to decrease neck pain in order to improve overall cervical function.   OBJECTIVE IMPAIRMENTS: decreased ROM, decreased strength, and pain.   ACTIVITY LIMITATIONS: carrying, lifting, standing, sleeping, reach over head, and hygiene/grooming  PARTICIPATION LIMITATIONS: meal prep, cleaning, driving, and occupation  PERSONAL FACTORS: Time since onset of injury/illness/exacerbation are also affecting patient's functional outcome.   REHAB POTENTIAL: Fair pain has persisted for 6 months and persists despite steroid injections  CLINICAL DECISION MAKING: Stable/uncomplicated  EVALUATION COMPLEXITY: Low   GOALS: Goals reviewed with patient? No  SHORT TERM GOALS: Target date: 12/12/2022    Patient will demonstrate independent understanding of HEP to be able effectively manage underlying MSK condition.  Baseline: NT  12/12/22: Performing independently  Goal status: ACHIEVED     LONG TERM GOALS: Target date: 02/06/2023  Pt will increase neck range of motion for flexion, extension, and bilateral  side bending by 10 degrees or more to assist with performing ADL's and job related tasks. Baseline:  Flexion 34  Extension 28  Right lateral flexion 17  Left lateral flexion 12   01/06/23  AROM  Flexion 35  Extension 20   Right lateral flexion 20  Left lateral flexion 20    Goal status: PARTIALLY MET   2.  Pt will increase periscapular strength by 1/3 (i.e. 4 to 4+) to assist with stability while lifting and reaching activities at work.  Baseline:  Shoulder flexion 4+ 4+  Shoulder abduction 4+ 4+  Middle trapezius 4 4  Lower trapezius 4- 3+   Goal status: ONGOING   3.  Patient will have improved function and activity level as evidenced by an increase in FOTO score by 10 points or more.   Baseline: 41 with target of 55  Goal status: ONGOING    PLAN:  PT FREQUENCY: 1-2x/week  PT DURATION: 10 weeks  PLANNED INTERVENTIONS: 97164- PT Re-evaluation, 97110-Therapeutic exercises, 97530- Therapeutic activity, 97112- Neuromuscular re-education, 97535- Self Care, 14782- Manual therapy, Dry Needling, Joint mobilization, and Moist heat  PLAN FOR NEXT SESSION: Reassess remainder of goals. Manual therapy first to reduce cervical muscle tension or use of heat followed by cervical ROM.   Ellin Goodie PT, DPT  [facility] [address], [place], [address] Phone: [phone]   Fax:  [phone]

## 2023-01-08 ENCOUNTER — Ambulatory Visit: Payer: Commercial Managed Care - PPO | Admitting: Physical Therapy

## 2023-01-09 ENCOUNTER — Encounter: Payer: Commercial Managed Care - PPO | Admitting: Physical Therapy

## 2023-01-13 ENCOUNTER — Ambulatory Visit: Payer: Commercial Managed Care - PPO | Admitting: Physical Therapy

## 2023-01-13 DIAGNOSIS — M542 Cervicalgia: Secondary | ICD-10-CM | POA: Diagnosis not present

## 2023-01-13 NOTE — Therapy (Signed)
OUTPATIENT PHYSICAL THERAPY CERVICAL TREATMENT   Patient Name: Robyn Gordon MRN: [id number] DOB:[birth year], 42 y.o., female Today's Date: 01/13/2023  END OF SESSION:  PT End of Session - 01/13/23 1827     Visit Number 6    Number of Visits 20    Date for PT Re-Evaluation 02/06/23    Authorization Type [employer]/UMR [id number]    Authorization Time Period 10    Authorization - Visit Number 6    Authorization - Number of Visits 20    Progress Note Due on Visit 10    PT Start Time 1820    PT Stop Time 1900    PT Time Calculation (min) 40 min    Activity Tolerance Patient tolerated treatment well    Behavior During Therapy WFL for tasks assessed/performed                Past Medical History:  Diagnosis Date   Hypertension    Past Surgical History:  Procedure Laterality Date   TUBAL LIGATION     There are no active problems to display for this patient.   PCP: Carren Rang, PA-C   REFERRING PROVIDER: Drake Leach, PA-C   REFERRING DIAG:  [phone] (ICD-10-CM) - Cervical radiculopathy M47.812 (ICD-10-CM) - Cervical spondylosis M54.2 (ICD-10-CM) - Neck pain    THERAPY DIAG:  Neck pain on left side  Rationale for Evaluation and Treatment: Rehabilitation  ONSET DATE: ~6 months  SUBJECTIVE:                                                                                                                                                                                                         SUBJECTIVE STATEMENT: Pt reports that she feels that her neck pain feels about the same as it did when she started. She continues to experience numbness and tingling when waking up in the morning and neck pain after work. Hand dominance: Right  PERTINENT HISTORY:  Left sided neck pain with pain, numbness and tingling on left side. Pain started as neck pain with temperature sensativity. Had a steroid injection which helped, but numbness has started to come back. Head position  affects symptoms down arm.  PAIN:  Are you having pain? Yes: NPRS scale: 3/10  Pain location: Neck, radiating to left arm and hand Pain description: Sharp, can be "burning cold" Aggravating factors: Reaching, looking around, lifting, sleeping Relieving factors: Heat, moving out of aggravating positions    PRECAUTIONS: None  RED FLAGS: None     WEIGHT BEARING RESTRICTIONS: No  FALLS:  Has patient fallen in  last 6 months? No  LIVING ENVIRONMENT: Lives with: lives with their family Lives in: House/apartment Stairs: Yes: Internal: 3 steps; none and External: 10 steps; on right going up Has following equipment at home: None  OCCUPATION: [employer]  PLOF: Independent  PATIENT GOALS: Pt wants to reduce pain and perform job related activities and ADL's with less pain and return to the gym and sleep comfortably   NEXT MD VISIT: maybe next month  OBJECTIVE:  Note: Objective measures were completed at Evaluation unless otherwise noted.  DIAGNOSTIC FINDINGS:  CLINICAL DATA:  Initial evaluation for chronic neck pain, numbness in right arm and fingers.   EXAM: MRI CERVICAL SPINE WITHOUT CONTRAST   TECHNIQUE: Multiplanar, multisequence MR imaging of the cervical spine was performed. No intravenous contrast was administered.   COMPARISON:  None Available.   FINDINGS: Alignment: Straightening of the normal cervical lordosis. Trace degenerative retrolisthesis of C6 on C7.   Vertebrae: Vertebral body height maintained without acute or chronic fracture. Bone marrow signal intensity within normal limits. Small benign hemangioma noted involving the right inferior articular process of C3. No worrisome osseous lesions. Mild reactive endplate changes present about the C5-6 and C6-7 interspaces. No other abnormal marrow edema.   Cord: Normal signal and morphology.   Posterior Fossa, vertebral arteries, paraspinal tissues: Empty sella. Visualized brain and posterior fossa  otherwise unremarkable. Craniocervical junction normal. Small retention cyst noted at the nasopharynx. Paraspinous soft tissues within normal limits. Normal flow voids seen within the vertebral arteries bilaterally.   Disc levels:   C2-C3: Minimal uncovertebral spurring without significant disc bulge. Mild right-sided facet hypertrophy. No spinal stenosis. Foramina remain patent.   C3-C4: Disc bulge with bilateral uncovertebral spurring, slightly asymmetric to the right. Flattening of the ventral thecal sac without significant spinal stenosis. Foramina remain adequately patent.   C4-C5: Central to right paracentral disc protrusion mildly indents the ventral thecal sac (series 19, image 11). Minimal flattening of the ventral cord without cord signal changes. Thecal sac remains fairly capacious without significant stenosis. Superimposed mild uncovertebral and facet hypertrophy without significant foraminal encroachment.   C5-C6: Degenerative intervertebral disc space narrowing. Left paracentral disc osteophyte complex flattens and indents the ventral thecal sac (series 9, image 16). Mild cord flattening without cord signal changes. Mild spinal stenosis. Superimposed uncovertebral spurring with resultant mild right with moderate left C6 foraminal narrowing.   C6-C7: Degenerative intervertebral disc space narrowing. Left paracentral disc osteophyte complex flattens and indents the ventral thecal sac. Mild cord flattening without cord signal changes. Mild spinal stenosis. Superimposed uncovertebral spurring with resultant moderate bilateral C7 foraminal stenosis.   C7-T1: Negative interspace. Mild right-sided facet hypertrophy. No canal or foraminal stenosis.   T1-2: Small left paracentral disc protrusion indents the ventral thecal sac (series 8, image 28). Mild facet hypertrophy. No spinal stenosis. Foramina remain patent.   IMPRESSION: 1. Small central to right paracentral  disc protrusion at C4-5 with minimal flattening of the ventral cord, but no significant spinal stenosis. 2. Left paracentral disc osteophyte complexes at C5-6 and C6-7 with resultant mild spinal stenosis, with moderate left C6 and bilateral C7 foraminal narrowing. 3. Small left paracentral disc protrusion at T1-2 without significant stenosis. 4. Empty sella. While this finding is often incidental in nature and of no clinical significance, this can also be seen in the setting of idiopathic intracranial hypertension.     Electronically Signed   By: Rise Mu M.D.   On: 09/07/2022 04:10  PATIENT SURVEYS:    COGNITION:  Overall cognitive status: Within functional limits for tasks assessed  SENSATION: Not tested  POSTURE: No Significant postural limitations    CERVICAL ROM:   Active ROM A/PROM (deg) eval  Flexion 34  Extension 28  Right lateral flexion 17  Left lateral flexion 12  Right rotation 60  Left rotation 60   (Blank rows = not tested)  UPPER EXTREMITY ROM:  Insert SmartText    Active/Passive ROM Right eval Left eval  Shoulder flexion    Shoulder extension    Shoulder abduction    Shoulder adduction    Shoulder extension    Shoulder internal rotation    Shoulder external rotation    Elbow flexion    Elbow extension    Wrist flexion    Wrist extension    Wrist ulnar deviation    Wrist radial deviation    Wrist pronation    Wrist supination     (Blank rows = not tested)  UPPER EXTREMITY MMT:  MMT Right eval Left eval  Shoulder flexion 4+ 4+  Shoulder extension    Shoulder abduction 4+ 4+  Shoulder adduction    Shoulder extension    Shoulder internal rotation    Shoulder external rotation    Middle trapezius 4 4  Lower trapezius 4- 3+  Elbow flexion    Elbow extension    Wrist flexion    Wrist extension    Wrist ulnar deviation    Wrist radial deviation    Wrist pronation    Wrist supination    Grip strength      (Blank rows = not tested)  CERVICAL SPECIAL TESTS:  Upper limb tension test (ULTT): Positive, Spurling's test: Positive, and Distraction test: Positive   TODAY'S TREATMENT:                                                                                                                              DATE:    01/13/23: UBE with seat at 6 with 2.5 forward and 2.5 backward  Periscapular MMT: -Mid Trap R/L 4/4  -Lower Trap R/L: 4/4   Seated Cervical Side Bend with overpressure using towel 2 x 10 with 2 sec hold   -Pt reports increased pain on left side of neck.  Supine Cervical Side Bend with overpressure using towel 2 x 10 with 2 sec hold  -Pt reports no increased pain  Supine Cervical Extension with overpressure using towel 2 x 10 with 2 sec hold  D2 Shoulder PNF Flexion and Extension with blue band 2 x 10   01/06/23: UBE 2.5 forward and 2.5 backward with resistance of 3 for 5 min  Shoulder Horizontal Abduction AROM 1 x 10  Shoulder Horizontal Abduction with yellow band 1 x 10   Cerrvical ROM   AROM PROM  Flexion 35 45  Extension 20  45  Right lateral flexion 20 45  Left lateral flexion 20 45   MANUAL  Suboccipital release  Education  on suboccipital release using a tennis ball    12/25/22: UBE 2.5 forward and 2.5 backward with resistance of 3 for 5 min  Cervical SNAG Self Mobilizations 2 x 10  -mod VC for hand placement  Seated Chin Tucks 1 x 10  -Pt reports increased left sided cervical radicular pain  Supine Chin Tucks 1 x 10  D2 PNF Flexion and Extension with green band 2 x 10  Left Median Nerve glide with elbow and wrist extension and neck side bend  -mod VC for how to sequence exercise.   12/18/22:  THEREX UBE 2.5 forward and 2.5 backward with resistance of 2.  FOTO: 41 with target off 55  Vertebrobasilar Insufficiency Test: Negative Bilateral  Cervical PROM  -Lateral Flexion R/L 45 deg/45 deg  -Flexion 45 deg  -Extension 45 deg    Upper Trap Stretch 3 x  30 sec   Median Nerve Glide 1 x 10  -Pt reports increased cervical pain  Median nerve glide with stationary neck and elbow and wrist movement 1 x 10    PATIENT EDUCATION:  Education details: Pt educated on results of tests and course of PT Person educated: Patient Education method: Explanation Education comprehension: verbalized understanding  HOME EXERCISE PROGRAM: Access Code: [email or web address] URL: [email or web address] Date: 01/13/2023 Prepared by: Ellin Goodie  Exercises - Seated Upper Trapezius Stretch  - 3-4 x weekly - 3 reps - 30 sec  hold - Supine Suboccipital Release with Tennis Balls  - 1 x daily - 3 reps - 5 min  hold - Standing Shoulder Single Arm PNF D2 Flexion with Resistance  - 3-4 x weekly - 3 sets - 10 reps - Standing Shoulder Horizontal Abduction with Resistance  - 3-4 x weekly - 3 sets - 10 reps - Seated Cervical Sidebending with Strap and Overpressure  - 3-4 x weekly - 2 sets - 10 reps - 2 sec  hold - Supine Cervical Flexion Extension on Pillow  - 3-4 x weekly - 2 sets - 10 reps - 2 sec  hold  ASSESSMENT:  CLINICAL IMPRESSION: Pt continues to progress towards goals with improvement in perception of cervical function and improvement in periscapular strength. She was limited by cervical pain during session especially on left side of neck. However, this improved with change in position from seated to supine, where she was able to perform all cervical ROM exercises without increasing her pain. She will continue to benefit from skilled PT to improve cervical ROM and periscapular strength to decrease neck pain in order to improve overall cervical function.   OBJECTIVE IMPAIRMENTS: decreased ROM, decreased strength, and pain.   ACTIVITY LIMITATIONS: carrying, lifting, standing, sleeping, reach over head, and hygiene/grooming  PARTICIPATION LIMITATIONS: meal prep, cleaning, driving, and occupation  PERSONAL FACTORS: Time since onset of  injury/illness/exacerbation are also affecting patient's functional outcome.   REHAB POTENTIAL: Fair pain has persisted for 6 months and persists despite steroid injections  CLINICAL DECISION MAKING: Stable/uncomplicated  EVALUATION COMPLEXITY: Low   GOALS: Goals reviewed with patient? No  SHORT TERM GOALS: Target date: 12/12/2022    Patient will demonstrate independent understanding of HEP to be able effectively manage underlying MSK condition.  Baseline: NT  12/12/22: Performing independently  Goal status: ACHIEVED     LONG TERM GOALS: Target date: 02/06/2023  Pt will increase neck range of motion for flexion, extension, and bilateral side bending by 10 degrees or more to assist with performing ADL's and job related tasks. Baseline:  Flexion 34  Extension 28  Right lateral flexion 17  Left lateral flexion 12   01/06/23  AROM  Flexion 35  Extension 20   Right lateral flexion 20  Left lateral flexion 20    Goal status: PARTIALLY MET   2.  Pt will increase periscapular strength by 1/3 (i.e. 4 to 4+) to assist with stability while lifting and reaching activities at work.                                           Eval Right Eval Left 01/13/23 01/13/23  Shoulder flexion 4+ 4+    Shoulder abduction 4+ 4+    Middle trapezius 4 4 4 4   Lower trapezius 4- 3+ 4 4   Goal status: ONGOING   3.  Patient will have improved function and activity level as evidenced by an increase in FOTO score by 10 points or more.   Baseline: 41 with target of 55 01/13/23: 46  Goal status: Part   PLAN:  PT FREQUENCY: 1-2x/week  PT DURATION: 10 weeks  PLANNED INTERVENTIONS: 97164- PT Re-evaluation, 97110-Therapeutic exercises, 97530- Therapeutic activity, 97112- Neuromuscular re-education, 97535- Self Care, 16109- Manual therapy, Dry Needling, Joint  mobilization, and Moist heat  PLAN FOR NEXT SESSION: Chin tucks. Manual therapy first to reduce cervical muscle tension or use of heat followed by cervical ROM.   Ellin Goodie PT, DPT  [facility] [address], [place], [address] Phone: [phone]   Fax:  [phone]

## 2023-01-20 ENCOUNTER — Ambulatory Visit: Payer: Commercial Managed Care - PPO | Admitting: Physical Therapy

## 2023-01-21 ENCOUNTER — Encounter: Payer: Commercial Managed Care - PPO | Admitting: Physical Therapy

## 2023-01-23 ENCOUNTER — Ambulatory Visit: Payer: Commercial Managed Care - PPO | Attending: Orthopedic Surgery | Admitting: Physical Therapy

## 2023-01-23 ENCOUNTER — Telehealth: Payer: Self-pay | Admitting: Physical Therapy

## 2023-01-23 NOTE — Telephone Encounter (Signed)
 Called pt to inquire about absence from PT. Did not reach patient so left VM instructing pt to call back to check in about upcoming appointments and whether time was ok for her work schedule.

## 2023-01-27 ENCOUNTER — Ambulatory Visit: Payer: Commercial Managed Care - PPO | Admitting: Physical Therapy

## 2023-01-29 ENCOUNTER — Ambulatory Visit: Payer: Commercial Managed Care - PPO | Admitting: Physical Therapy

## 2023-11-19 ENCOUNTER — Other Ambulatory Visit: Payer: Self-pay | Admitting: Nurse Practitioner

## 2023-11-19 DIAGNOSIS — Z1231 Encounter for screening mammogram for malignant neoplasm of breast: Secondary | ICD-10-CM

## 2023-11-20 ENCOUNTER — Ambulatory Visit
Admission: RE | Admit: 2023-11-20 | Discharge: 2023-11-20 | Disposition: A | Discharge status: Home / Self Care | Source: Ambulatory Visit | Attending: Nurse Practitioner | Admitting: Nurse Practitioner

## 2023-11-20 DIAGNOSIS — Z1231 Encounter for screening mammogram for malignant neoplasm of breast: Secondary | ICD-10-CM
